# Patient Record
Sex: Female | Born: 1940 | Race: White | Hispanic: No | Marital: Married | Smoking: Never smoker
Health system: Southern US, Community
[De-identification: ages and names within clinical notes are randomized; demographics above are authoritative.]

## PROBLEM LIST (undated history)

## (undated) DIAGNOSIS — E785 Hyperlipidemia, unspecified: Secondary | ICD-10-CM

## (undated) DIAGNOSIS — T7840XA Allergy, unspecified, initial encounter: Secondary | ICD-10-CM

## (undated) DIAGNOSIS — E079 Disorder of thyroid, unspecified: Secondary | ICD-10-CM

## (undated) DIAGNOSIS — Z86711 Personal history of pulmonary embolism: Secondary | ICD-10-CM

## (undated) DIAGNOSIS — J40 Bronchitis, not specified as acute or chronic: Secondary | ICD-10-CM

## (undated) DIAGNOSIS — K5792 Diverticulitis of intestine, part unspecified, without perforation or abscess without bleeding: Secondary | ICD-10-CM

## (undated) HISTORY — PX: HERNIA REPAIR: SHX51

## (undated) HISTORY — DX: Allergy, unspecified, initial encounter: T78.40XA

## (undated) HISTORY — PX: COLONOSCOPY: SHX174

## (undated) HISTORY — DX: Hyperlipidemia, unspecified: E78.5

## (undated) HISTORY — PX: ABDOMINAL HYSTERECTOMY: SUR658

## (undated) HISTORY — DX: Personal history of pulmonary embolism: Z86.711

## (undated) HISTORY — PX: TONSILLECTOMY: SUR1361

## (undated) HISTORY — DX: Bronchitis, not specified as acute or chronic: J40

## (undated) HISTORY — DX: Disorder of thyroid, unspecified: E07.9

## (undated) HISTORY — PX: BACK SURGERY: SHX140

## (undated) HISTORY — PX: POLYPECTOMY: SHX149

---

## 2010-02-19 ENCOUNTER — Observation Stay (HOSPITAL_COMMUNITY): Admission: RE | Admit: 2010-02-19 | Discharge: 2010-02-20 | Payer: Self-pay | Admitting: Orthopedic Surgery

## 2010-07-24 LAB — COMPREHENSIVE METABOLIC PANEL
Alkaline Phosphatase: 73 U/L (ref 39–117)
BUN: 10 mg/dL (ref 6–23)
Chloride: 99 mEq/L (ref 96–112)
Creatinine, Ser: 0.9 mg/dL (ref 0.4–1.2)
Glucose, Bld: 115 mg/dL — ABNORMAL HIGH (ref 70–99)
Potassium: 2.9 mEq/L — ABNORMAL LOW (ref 3.5–5.1)
Total Bilirubin: 1 mg/dL (ref 0.3–1.2)
Total Protein: 6.7 g/dL (ref 6.0–8.3)

## 2010-07-24 LAB — SURGICAL PCR SCREEN
MRSA, PCR: NEGATIVE
Staphylococcus aureus: NEGATIVE

## 2010-07-24 LAB — CBC
HCT: 39.1 % (ref 36.0–46.0)
MCH: 32.4 pg (ref 26.0–34.0)
MCV: 95.1 fL (ref 78.0–100.0)
RDW: 12.9 % (ref 11.5–15.5)
WBC: 8.4 10*3/uL (ref 4.0–10.5)

## 2010-12-12 ENCOUNTER — Other Ambulatory Visit (HOSPITAL_COMMUNITY): Payer: Self-pay | Admitting: Orthopedic Surgery

## 2010-12-12 ENCOUNTER — Other Ambulatory Visit: Payer: Self-pay | Admitting: Orthopedic Surgery

## 2010-12-12 ENCOUNTER — Encounter (HOSPITAL_COMMUNITY): Payer: Medicare Other

## 2010-12-12 ENCOUNTER — Ambulatory Visit (HOSPITAL_COMMUNITY)
Admission: RE | Admit: 2010-12-12 | Discharge: 2010-12-12 | Disposition: A | Discharge status: Home / Self Care | Payer: Medicare Other | Source: Ambulatory Visit | Attending: Orthopedic Surgery | Admitting: Orthopedic Surgery

## 2010-12-12 DIAGNOSIS — Z79899 Other long term (current) drug therapy: Secondary | ICD-10-CM | POA: Insufficient documentation

## 2010-12-12 DIAGNOSIS — M4800 Spinal stenosis, site unspecified: Secondary | ICD-10-CM | POA: Insufficient documentation

## 2010-12-12 DIAGNOSIS — Z01812 Encounter for preprocedural laboratory examination: Secondary | ICD-10-CM | POA: Insufficient documentation

## 2010-12-12 DIAGNOSIS — I10 Essential (primary) hypertension: Secondary | ICD-10-CM | POA: Insufficient documentation

## 2010-12-12 DIAGNOSIS — Z01818 Encounter for other preprocedural examination: Secondary | ICD-10-CM

## 2010-12-12 DIAGNOSIS — Z01810 Encounter for preprocedural cardiovascular examination: Secondary | ICD-10-CM | POA: Insufficient documentation

## 2010-12-12 LAB — CBC
MCH: 31.8 pg (ref 26.0–34.0)
MCHC: 34.4 g/dL (ref 30.0–36.0)
Platelets: 273 10*3/uL (ref 150–400)
RDW: 13.4 % (ref 11.5–15.5)

## 2010-12-12 LAB — BASIC METABOLIC PANEL
Calcium: 9.5 mg/dL (ref 8.4–10.5)
GFR calc Af Amer: >60 mL/min (ref >60)
GFR calc non Af Amer: 50 mL/min — ABNORMAL LOW (ref >60)
Sodium: 138 mEq/L (ref 135–145)

## 2010-12-12 LAB — SURGICAL PCR SCREEN
MRSA, PCR: NEGATIVE
Staphylococcus aureus: NEGATIVE

## 2010-12-17 ENCOUNTER — Ambulatory Visit (HOSPITAL_COMMUNITY): Payer: Medicare Other

## 2010-12-17 ENCOUNTER — Other Ambulatory Visit: Payer: Self-pay | Admitting: Orthopedic Surgery

## 2010-12-17 ENCOUNTER — Ambulatory Visit (HOSPITAL_COMMUNITY)
Admission: RE | Admit: 2010-12-17 | Discharge: 2010-12-18 | Disposition: A | Discharge status: Home / Self Care | Payer: Medicare Other | Source: Ambulatory Visit | Attending: Orthopedic Surgery | Admitting: Orthopedic Surgery

## 2010-12-17 DIAGNOSIS — M7130 Other bursal cyst, unspecified site: Secondary | ICD-10-CM | POA: Insufficient documentation

## 2010-12-17 DIAGNOSIS — I10 Essential (primary) hypertension: Secondary | ICD-10-CM | POA: Insufficient documentation

## 2010-12-17 DIAGNOSIS — E039 Hypothyroidism, unspecified: Secondary | ICD-10-CM | POA: Insufficient documentation

## 2010-12-17 DIAGNOSIS — M48061 Spinal stenosis, lumbar region without neurogenic claudication: Secondary | ICD-10-CM | POA: Insufficient documentation

## 2010-12-17 DIAGNOSIS — Z79899 Other long term (current) drug therapy: Secondary | ICD-10-CM | POA: Insufficient documentation

## 2010-12-17 DIAGNOSIS — M79609 Pain in unspecified limb: Secondary | ICD-10-CM | POA: Insufficient documentation

## 2010-12-22 NOTE — Op Note (Signed)
Laurie Parker, HESLIN NO.:  [id number]  MEDICAL RECORD NO.:  [id number]  LOCATION:  1618                         FACILITY:  [facility]  PHYSICIAN:  Marlowe Kays, M.D.  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  OPERATION:  Subtotal resection of theinferior articular facet of L3 left with excision of synovial cyst and decompression of the lateral recess of the spine.  SURGEON:  Marlowe Kays, M.D.  ASSISTANT:  Georges Lynch. Darrelyn Hillock, M.D.  ANESTHESIA:  General.  INDICATION OF PROCEDURE:  Original back procedure on her was in October of last year.  Initially, she did well.  Recently, she has had progressive left leg pain with an MRI demonstrating the preoperative diagnosis.  Accordingly, she is here today for the above-mentioned surgery.  DESCRIPTION OF THE PROCEDURE:  Prophylactic antibiotics, satisfied general anesthesia, Foley catheter inserted, and positioned in the knee- chest position on the Andrews frame, back was prepped with DuraPrep and draped in sterile field.  Time-out was performed.  I went through the superior portion of the old incision and tagged the spinous process which I thought was most likely L2 and then placed Penfield 4 instrument distally on the left to try and identify the L3-4 disk since a synovial cyst is located at this level.  X-ray demonstrated that the Zannie Cove was indeed on spinous process of L2 and that the disk was slightly distal to the [employer].  Accordingly, I replaced the [facility] instrument, took second lateral x-ray, and this was basically right at the L3-4 disk.  Using this as a guideline and working  with combination of instruments with the small and large curettes, 2 and 3 mm Kerrison rongeurs and double-action rongeur was able to define a bony perimeter superiorly, inferiorly, and lateral.  After trying several avenues to expose the L3-4 disk area, it worked out that tacking the L3-4 inferior facet worked best and 2 mm Kerrison rongeur began resecting some of the facet and found what we felt was the synovial cyst material and I saved a portion of this for pathology.  I then continued on subtotal facetectomy, working towards the midline and superiorly until I was able to get to the spinal canal safely.  Then I was able to work laterally and distally down to the area of the L4.  There was no more cyst noted. Then, we able to adequately decompress the spinal canal from top to bottom.  Distally, the L4 nerve root was identified and we were able to place a nerve hook.  I was satisfied that we had excised the cyst and decompressed the spinal canal, and also the L4 nerve root, felt that we had accomplished our goals.  Wound was irrigated with sterile saline. Gelfoam soaked in thrombin was placed in the operative bed in the left, self-retaining spinal retractors were moved with no  unusual bleeding.  I then closed the wound in layers with interrupted 1 Vicryl in the fascia and deep subcutaneous tissue, 2-0 Vicryl to the patient's subcutaneous tissue, staples in the skin, and dry sterile dressing were applied.  She tolerated the procedure well, and the patient was on the way to recovery room in satisfactory condition with no complications. Estimated blood loss was perhaps 150 cc.  No blood replaced.          ______________________________ Marlowe Kays, M.D.     JA/MEDQ  D:  12/17/2010  T:  12/18/2010  Job:  [id number]  Electronically Signed by Marlowe Kays M.D. on 12/22/2010 01:14:53 PM

## 2011-05-12 HISTORY — PX: COLOSTOMY: SHX63

## 2011-05-15 IMAGING — CR DG OR PORTABLE SPINE
1 series · 1 of 1 positions shown · non-contrast
Comparison: None

CLINICAL DATA: Herniated disc L3-4

PORTABLE SPINE

[series 1]
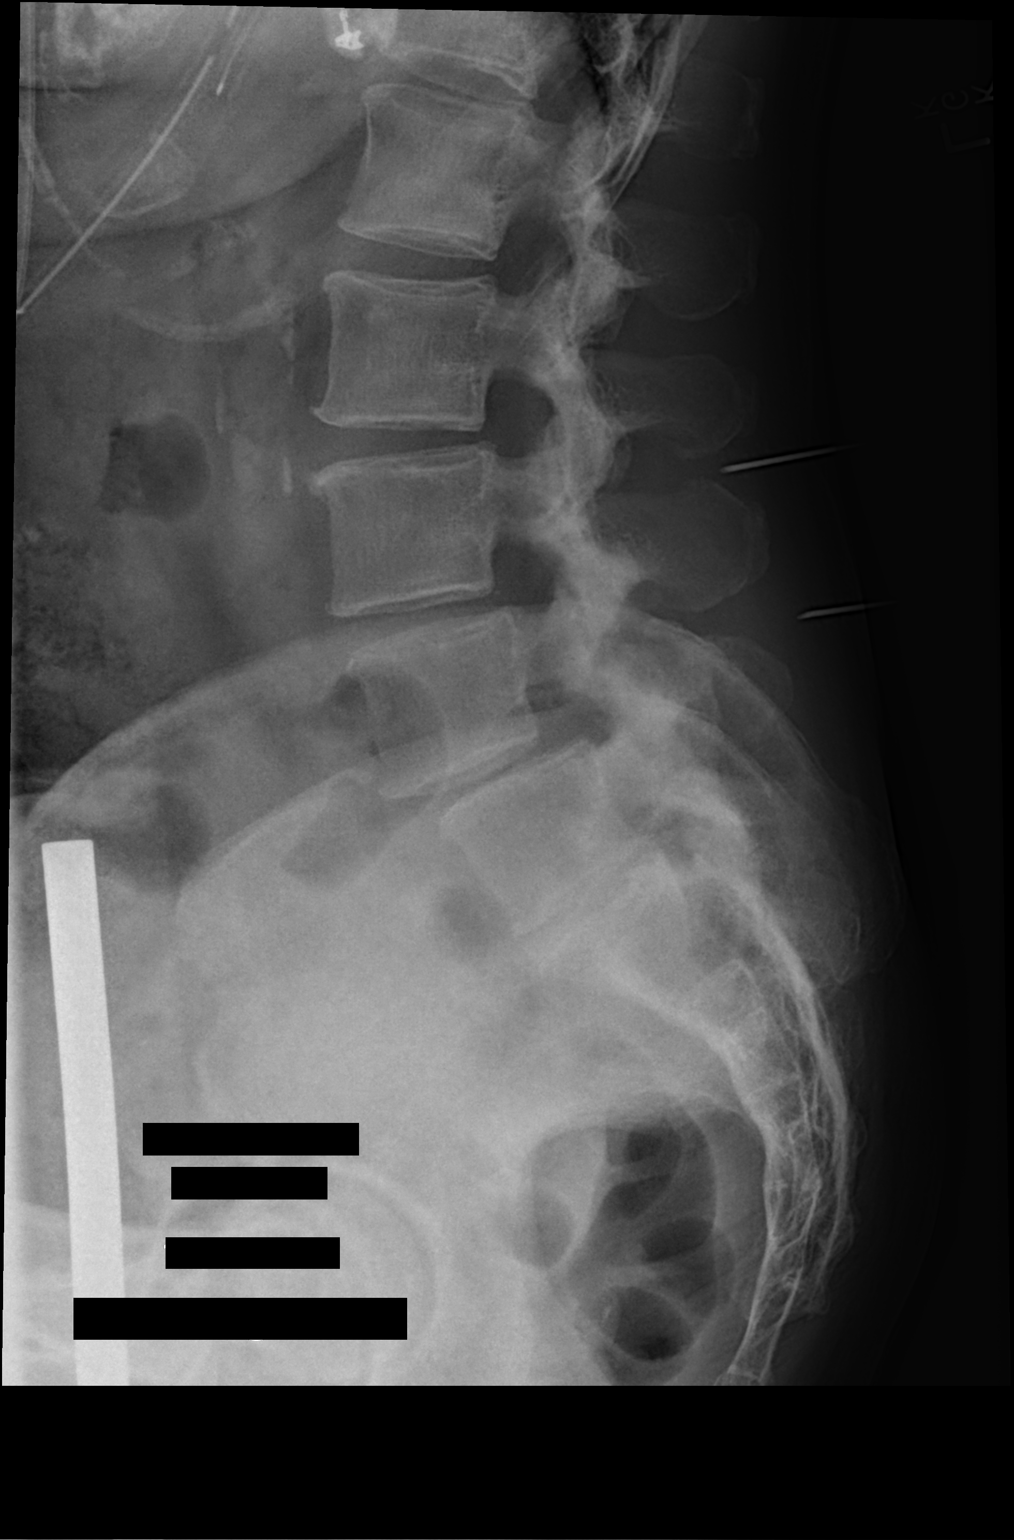

[1 of 1 positions shown; findings below may reference images not displayed]

FINDINGS: No prior studies are available to determine the lumbar
level assignment.  I will assume that the lowest disc space is L5-
S1.  Grade 1/II slip of L4-L5 is present.  Negative for fracture.

There is a needle between the spinous processes of L2 and L3.
There is a second needle directed between the spinous processes of
L3 and L4.
IMPRESSION: Anterior slip of L4-L5.  Lowest disc spaces L5-S1.

Lumbar localization as above.

## 2011-06-03 DIAGNOSIS — D126 Benign neoplasm of colon, unspecified: Secondary | ICD-10-CM | POA: Diagnosis not present

## 2011-06-12 DIAGNOSIS — J45909 Unspecified asthma, uncomplicated: Secondary | ICD-10-CM | POA: Diagnosis not present

## 2011-06-12 DIAGNOSIS — Z79899 Other long term (current) drug therapy: Secondary | ICD-10-CM | POA: Diagnosis not present

## 2011-06-12 DIAGNOSIS — K389 Disease of appendix, unspecified: Secondary | ICD-10-CM | POA: Diagnosis not present

## 2011-06-12 DIAGNOSIS — Z85828 Personal history of other malignant neoplasm of skin: Secondary | ICD-10-CM | POA: Diagnosis not present

## 2011-06-12 DIAGNOSIS — I10 Essential (primary) hypertension: Secondary | ICD-10-CM | POA: Diagnosis not present

## 2011-06-12 DIAGNOSIS — D126 Benign neoplasm of colon, unspecified: Secondary | ICD-10-CM | POA: Diagnosis not present

## 2011-06-26 DIAGNOSIS — E785 Hyperlipidemia, unspecified: Secondary | ICD-10-CM | POA: Diagnosis not present

## 2011-06-26 DIAGNOSIS — I059 Rheumatic mitral valve disease, unspecified: Secondary | ICD-10-CM | POA: Diagnosis not present

## 2011-06-26 DIAGNOSIS — J309 Allergic rhinitis, unspecified: Secondary | ICD-10-CM | POA: Diagnosis present

## 2011-06-26 DIAGNOSIS — J9620 Acute and chronic respiratory failure, unspecified whether with hypoxia or hypercapnia: Secondary | ICD-10-CM | POA: Diagnosis not present

## 2011-06-26 DIAGNOSIS — E039 Hypothyroidism, unspecified: Secondary | ICD-10-CM | POA: Diagnosis not present

## 2011-06-26 DIAGNOSIS — I509 Heart failure, unspecified: Secondary | ICD-10-CM | POA: Diagnosis not present

## 2011-06-26 DIAGNOSIS — I2699 Other pulmonary embolism without acute cor pulmonale: Secondary | ICD-10-CM | POA: Diagnosis not present

## 2011-06-26 DIAGNOSIS — I130 Hypertensive heart and chronic kidney disease with heart failure and stage 1 through stage 4 chronic kidney disease, or unspecified chronic kidney disease: Secondary | ICD-10-CM | POA: Diagnosis not present

## 2011-06-26 DIAGNOSIS — D638 Anemia in other chronic diseases classified elsewhere: Secondary | ICD-10-CM | POA: Diagnosis not present

## 2011-06-26 DIAGNOSIS — K219 Gastro-esophageal reflux disease without esophagitis: Secondary | ICD-10-CM | POA: Diagnosis not present

## 2011-06-26 DIAGNOSIS — D649 Anemia, unspecified: Secondary | ICD-10-CM | POA: Diagnosis not present

## 2011-06-26 DIAGNOSIS — M159 Polyosteoarthritis, unspecified: Secondary | ICD-10-CM | POA: Diagnosis not present

## 2011-06-26 DIAGNOSIS — I119 Hypertensive heart disease without heart failure: Secondary | ICD-10-CM | POA: Diagnosis not present

## 2011-06-26 DIAGNOSIS — IMO0002 Reserved for concepts with insufficient information to code with codable children: Secondary | ICD-10-CM | POA: Diagnosis present

## 2011-06-26 DIAGNOSIS — J9600 Acute respiratory failure, unspecified whether with hypoxia or hypercapnia: Secondary | ICD-10-CM | POA: Diagnosis not present

## 2011-07-01 DIAGNOSIS — I2699 Other pulmonary embolism without acute cor pulmonale: Secondary | ICD-10-CM | POA: Diagnosis not present

## 2011-07-01 DIAGNOSIS — D649 Anemia, unspecified: Secondary | ICD-10-CM | POA: Diagnosis not present

## 2011-07-03 DIAGNOSIS — R791 Abnormal coagulation profile: Secondary | ICD-10-CM | POA: Diagnosis not present

## 2011-07-07 DIAGNOSIS — R791 Abnormal coagulation profile: Secondary | ICD-10-CM | POA: Diagnosis not present

## 2011-07-08 DIAGNOSIS — D649 Anemia, unspecified: Secondary | ICD-10-CM | POA: Diagnosis not present

## 2011-07-08 DIAGNOSIS — R05 Cough: Secondary | ICD-10-CM | POA: Diagnosis not present

## 2011-07-08 DIAGNOSIS — I2699 Other pulmonary embolism without acute cor pulmonale: Secondary | ICD-10-CM | POA: Diagnosis not present

## 2011-07-14 DIAGNOSIS — R791 Abnormal coagulation profile: Secondary | ICD-10-CM | POA: Diagnosis not present

## 2011-07-14 DIAGNOSIS — Z7901 Long term (current) use of anticoagulants: Secondary | ICD-10-CM | POA: Diagnosis not present

## 2011-07-21 DIAGNOSIS — R791 Abnormal coagulation profile: Secondary | ICD-10-CM | POA: Diagnosis not present

## 2011-07-28 DIAGNOSIS — Z7901 Long term (current) use of anticoagulants: Secondary | ICD-10-CM | POA: Diagnosis not present

## 2011-08-06 DIAGNOSIS — Z7901 Long term (current) use of anticoagulants: Secondary | ICD-10-CM | POA: Diagnosis not present

## 2011-08-06 DIAGNOSIS — M79609 Pain in unspecified limb: Secondary | ICD-10-CM | POA: Diagnosis not present

## 2011-08-06 DIAGNOSIS — M7730 Calcaneal spur, unspecified foot: Secondary | ICD-10-CM | POA: Diagnosis not present

## 2011-08-11 DIAGNOSIS — R791 Abnormal coagulation profile: Secondary | ICD-10-CM | POA: Diagnosis not present

## 2011-08-11 DIAGNOSIS — Z7901 Long term (current) use of anticoagulants: Secondary | ICD-10-CM | POA: Diagnosis not present

## 2011-08-20 DIAGNOSIS — I10 Essential (primary) hypertension: Secondary | ICD-10-CM | POA: Diagnosis not present

## 2011-08-20 DIAGNOSIS — D509 Iron deficiency anemia, unspecified: Secondary | ICD-10-CM | POA: Diagnosis not present

## 2011-08-20 DIAGNOSIS — K219 Gastro-esophageal reflux disease without esophagitis: Secondary | ICD-10-CM | POA: Diagnosis not present

## 2011-08-20 DIAGNOSIS — E785 Hyperlipidemia, unspecified: Secondary | ICD-10-CM | POA: Diagnosis not present

## 2011-08-20 DIAGNOSIS — E039 Hypothyroidism, unspecified: Secondary | ICD-10-CM | POA: Diagnosis not present

## 2011-08-20 DIAGNOSIS — I2699 Other pulmonary embolism without acute cor pulmonale: Secondary | ICD-10-CM | POA: Diagnosis not present

## 2011-08-20 DIAGNOSIS — Z1231 Encounter for screening mammogram for malignant neoplasm of breast: Secondary | ICD-10-CM | POA: Diagnosis not present

## 2011-08-25 DIAGNOSIS — R791 Abnormal coagulation profile: Secondary | ICD-10-CM | POA: Diagnosis not present

## 2011-08-28 DIAGNOSIS — Z1231 Encounter for screening mammogram for malignant neoplasm of breast: Secondary | ICD-10-CM | POA: Diagnosis not present

## 2011-09-04 DIAGNOSIS — Z7901 Long term (current) use of anticoagulants: Secondary | ICD-10-CM | POA: Diagnosis not present

## 2011-09-06 DIAGNOSIS — Z7901 Long term (current) use of anticoagulants: Secondary | ICD-10-CM | POA: Diagnosis not present

## 2011-09-06 DIAGNOSIS — R079 Chest pain, unspecified: Secondary | ICD-10-CM | POA: Diagnosis not present

## 2011-09-06 DIAGNOSIS — E039 Hypothyroidism, unspecified: Secondary | ICD-10-CM | POA: Diagnosis not present

## 2011-09-06 DIAGNOSIS — R0789 Other chest pain: Secondary | ICD-10-CM | POA: Diagnosis not present

## 2011-09-18 DIAGNOSIS — Z7901 Long term (current) use of anticoagulants: Secondary | ICD-10-CM | POA: Diagnosis not present

## 2011-10-19 DIAGNOSIS — Z7901 Long term (current) use of anticoagulants: Secondary | ICD-10-CM | POA: Diagnosis not present

## 2011-10-20 DIAGNOSIS — E039 Hypothyroidism, unspecified: Secondary | ICD-10-CM | POA: Diagnosis not present

## 2011-10-20 DIAGNOSIS — IMO0002 Reserved for concepts with insufficient information to code with codable children: Secondary | ICD-10-CM | POA: Diagnosis not present

## 2011-10-20 DIAGNOSIS — I2699 Other pulmonary embolism without acute cor pulmonale: Secondary | ICD-10-CM | POA: Diagnosis not present

## 2011-10-20 DIAGNOSIS — K219 Gastro-esophageal reflux disease without esophagitis: Secondary | ICD-10-CM | POA: Diagnosis not present

## 2011-10-20 DIAGNOSIS — D509 Iron deficiency anemia, unspecified: Secondary | ICD-10-CM | POA: Diagnosis not present

## 2011-10-20 DIAGNOSIS — E785 Hyperlipidemia, unspecified: Secondary | ICD-10-CM | POA: Diagnosis not present

## 2011-10-20 DIAGNOSIS — Z79899 Other long term (current) drug therapy: Secondary | ICD-10-CM | POA: Diagnosis not present

## 2011-11-11 DIAGNOSIS — Z7901 Long term (current) use of anticoagulants: Secondary | ICD-10-CM | POA: Diagnosis not present

## 2011-11-17 DIAGNOSIS — M1990 Unspecified osteoarthritis, unspecified site: Secondary | ICD-10-CM | POA: Diagnosis not present

## 2011-11-17 DIAGNOSIS — M546 Pain in thoracic spine: Secondary | ICD-10-CM | POA: Diagnosis not present

## 2011-11-17 DIAGNOSIS — M4120 Other idiopathic scoliosis, site unspecified: Secondary | ICD-10-CM | POA: Diagnosis not present

## 2011-11-23 DIAGNOSIS — R937 Abnormal findings on diagnostic imaging of other parts of musculoskeletal system: Secondary | ICD-10-CM | POA: Diagnosis not present

## 2011-11-23 DIAGNOSIS — M546 Pain in thoracic spine: Secondary | ICD-10-CM | POA: Diagnosis not present

## 2011-11-23 DIAGNOSIS — M47817 Spondylosis without myelopathy or radiculopathy, lumbosacral region: Secondary | ICD-10-CM | POA: Diagnosis not present

## 2011-12-03 DIAGNOSIS — M546 Pain in thoracic spine: Secondary | ICD-10-CM | POA: Diagnosis not present

## 2011-12-03 DIAGNOSIS — M1990 Unspecified osteoarthritis, unspecified site: Secondary | ICD-10-CM | POA: Diagnosis not present

## 2011-12-03 DIAGNOSIS — M4310 Spondylolisthesis, site unspecified: Secondary | ICD-10-CM | POA: Diagnosis not present

## 2011-12-10 DIAGNOSIS — Z7901 Long term (current) use of anticoagulants: Secondary | ICD-10-CM | POA: Diagnosis not present

## 2011-12-18 DIAGNOSIS — R29898 Other symptoms and signs involving the musculoskeletal system: Secondary | ICD-10-CM | POA: Diagnosis not present

## 2011-12-18 DIAGNOSIS — IMO0001 Reserved for inherently not codable concepts without codable children: Secondary | ICD-10-CM | POA: Diagnosis not present

## 2011-12-18 DIAGNOSIS — M47812 Spondylosis without myelopathy or radiculopathy, cervical region: Secondary | ICD-10-CM | POA: Diagnosis not present

## 2011-12-18 DIAGNOSIS — M1990 Unspecified osteoarthritis, unspecified site: Secondary | ICD-10-CM | POA: Diagnosis not present

## 2011-12-18 DIAGNOSIS — M6281 Muscle weakness (generalized): Secondary | ICD-10-CM | POA: Diagnosis not present

## 2011-12-22 DIAGNOSIS — M1990 Unspecified osteoarthritis, unspecified site: Secondary | ICD-10-CM | POA: Diagnosis not present

## 2011-12-22 DIAGNOSIS — IMO0001 Reserved for inherently not codable concepts without codable children: Secondary | ICD-10-CM | POA: Diagnosis not present

## 2011-12-22 DIAGNOSIS — M47812 Spondylosis without myelopathy or radiculopathy, cervical region: Secondary | ICD-10-CM | POA: Diagnosis not present

## 2011-12-22 DIAGNOSIS — R29898 Other symptoms and signs involving the musculoskeletal system: Secondary | ICD-10-CM | POA: Diagnosis not present

## 2011-12-22 DIAGNOSIS — M6281 Muscle weakness (generalized): Secondary | ICD-10-CM | POA: Diagnosis not present

## 2011-12-24 DIAGNOSIS — M47812 Spondylosis without myelopathy or radiculopathy, cervical region: Secondary | ICD-10-CM | POA: Diagnosis not present

## 2011-12-24 DIAGNOSIS — M6281 Muscle weakness (generalized): Secondary | ICD-10-CM | POA: Diagnosis not present

## 2011-12-24 DIAGNOSIS — R29898 Other symptoms and signs involving the musculoskeletal system: Secondary | ICD-10-CM | POA: Diagnosis not present

## 2011-12-24 DIAGNOSIS — IMO0001 Reserved for inherently not codable concepts without codable children: Secondary | ICD-10-CM | POA: Diagnosis not present

## 2011-12-24 DIAGNOSIS — M1990 Unspecified osteoarthritis, unspecified site: Secondary | ICD-10-CM | POA: Diagnosis not present

## 2011-12-28 DIAGNOSIS — R29898 Other symptoms and signs involving the musculoskeletal system: Secondary | ICD-10-CM | POA: Diagnosis not present

## 2011-12-28 DIAGNOSIS — IMO0001 Reserved for inherently not codable concepts without codable children: Secondary | ICD-10-CM | POA: Diagnosis not present

## 2011-12-28 DIAGNOSIS — M6281 Muscle weakness (generalized): Secondary | ICD-10-CM | POA: Diagnosis not present

## 2011-12-28 DIAGNOSIS — M1990 Unspecified osteoarthritis, unspecified site: Secondary | ICD-10-CM | POA: Diagnosis not present

## 2011-12-28 DIAGNOSIS — M47812 Spondylosis without myelopathy or radiculopathy, cervical region: Secondary | ICD-10-CM | POA: Diagnosis not present

## 2011-12-31 DIAGNOSIS — R29898 Other symptoms and signs involving the musculoskeletal system: Secondary | ICD-10-CM | POA: Diagnosis not present

## 2011-12-31 DIAGNOSIS — IMO0001 Reserved for inherently not codable concepts without codable children: Secondary | ICD-10-CM | POA: Diagnosis not present

## 2011-12-31 DIAGNOSIS — M6281 Muscle weakness (generalized): Secondary | ICD-10-CM | POA: Diagnosis not present

## 2011-12-31 DIAGNOSIS — M47812 Spondylosis without myelopathy or radiculopathy, cervical region: Secondary | ICD-10-CM | POA: Diagnosis not present

## 2011-12-31 DIAGNOSIS — M1990 Unspecified osteoarthritis, unspecified site: Secondary | ICD-10-CM | POA: Diagnosis not present

## 2012-01-05 DIAGNOSIS — M6281 Muscle weakness (generalized): Secondary | ICD-10-CM | POA: Diagnosis not present

## 2012-01-05 DIAGNOSIS — M47812 Spondylosis without myelopathy or radiculopathy, cervical region: Secondary | ICD-10-CM | POA: Diagnosis not present

## 2012-01-05 DIAGNOSIS — IMO0001 Reserved for inherently not codable concepts without codable children: Secondary | ICD-10-CM | POA: Diagnosis not present

## 2012-01-05 DIAGNOSIS — R29898 Other symptoms and signs involving the musculoskeletal system: Secondary | ICD-10-CM | POA: Diagnosis not present

## 2012-01-05 DIAGNOSIS — M1990 Unspecified osteoarthritis, unspecified site: Secondary | ICD-10-CM | POA: Diagnosis not present

## 2012-01-08 DIAGNOSIS — M6281 Muscle weakness (generalized): Secondary | ICD-10-CM | POA: Diagnosis not present

## 2012-01-08 DIAGNOSIS — IMO0001 Reserved for inherently not codable concepts without codable children: Secondary | ICD-10-CM | POA: Diagnosis not present

## 2012-01-08 DIAGNOSIS — R29898 Other symptoms and signs involving the musculoskeletal system: Secondary | ICD-10-CM | POA: Diagnosis not present

## 2012-01-08 DIAGNOSIS — M47812 Spondylosis without myelopathy or radiculopathy, cervical region: Secondary | ICD-10-CM | POA: Diagnosis not present

## 2012-01-08 DIAGNOSIS — M1990 Unspecified osteoarthritis, unspecified site: Secondary | ICD-10-CM | POA: Diagnosis not present

## 2012-01-18 DIAGNOSIS — M47812 Spondylosis without myelopathy or radiculopathy, cervical region: Secondary | ICD-10-CM | POA: Diagnosis not present

## 2012-01-18 DIAGNOSIS — R29898 Other symptoms and signs involving the musculoskeletal system: Secondary | ICD-10-CM | POA: Diagnosis not present

## 2012-01-18 DIAGNOSIS — M6281 Muscle weakness (generalized): Secondary | ICD-10-CM | POA: Diagnosis not present

## 2012-01-18 DIAGNOSIS — R791 Abnormal coagulation profile: Secondary | ICD-10-CM | POA: Diagnosis not present

## 2012-01-18 DIAGNOSIS — IMO0001 Reserved for inherently not codable concepts without codable children: Secondary | ICD-10-CM | POA: Diagnosis not present

## 2012-01-18 DIAGNOSIS — M1990 Unspecified osteoarthritis, unspecified site: Secondary | ICD-10-CM | POA: Diagnosis not present

## 2012-01-20 DIAGNOSIS — Z79899 Other long term (current) drug therapy: Secondary | ICD-10-CM | POA: Diagnosis not present

## 2012-01-20 DIAGNOSIS — R29898 Other symptoms and signs involving the musculoskeletal system: Secondary | ICD-10-CM | POA: Diagnosis not present

## 2012-01-20 DIAGNOSIS — M159 Polyosteoarthritis, unspecified: Secondary | ICD-10-CM | POA: Diagnosis not present

## 2012-01-20 DIAGNOSIS — IMO0001 Reserved for inherently not codable concepts without codable children: Secondary | ICD-10-CM | POA: Diagnosis not present

## 2012-01-20 DIAGNOSIS — M47812 Spondylosis without myelopathy or radiculopathy, cervical region: Secondary | ICD-10-CM | POA: Diagnosis not present

## 2012-01-20 DIAGNOSIS — M1990 Unspecified osteoarthritis, unspecified site: Secondary | ICD-10-CM | POA: Diagnosis not present

## 2012-01-20 DIAGNOSIS — M6281 Muscle weakness (generalized): Secondary | ICD-10-CM | POA: Diagnosis not present

## 2012-01-20 DIAGNOSIS — K219 Gastro-esophageal reflux disease without esophagitis: Secondary | ICD-10-CM | POA: Diagnosis not present

## 2012-01-20 DIAGNOSIS — E039 Hypothyroidism, unspecified: Secondary | ICD-10-CM | POA: Diagnosis not present

## 2012-01-22 DIAGNOSIS — R29898 Other symptoms and signs involving the musculoskeletal system: Secondary | ICD-10-CM | POA: Diagnosis not present

## 2012-01-22 DIAGNOSIS — IMO0001 Reserved for inherently not codable concepts without codable children: Secondary | ICD-10-CM | POA: Diagnosis not present

## 2012-01-22 DIAGNOSIS — M1990 Unspecified osteoarthritis, unspecified site: Secondary | ICD-10-CM | POA: Diagnosis not present

## 2012-01-22 DIAGNOSIS — M6281 Muscle weakness (generalized): Secondary | ICD-10-CM | POA: Diagnosis not present

## 2012-01-22 DIAGNOSIS — M47812 Spondylosis without myelopathy or radiculopathy, cervical region: Secondary | ICD-10-CM | POA: Diagnosis not present

## 2012-01-26 DIAGNOSIS — M1990 Unspecified osteoarthritis, unspecified site: Secondary | ICD-10-CM | POA: Diagnosis not present

## 2012-01-26 DIAGNOSIS — R29898 Other symptoms and signs involving the musculoskeletal system: Secondary | ICD-10-CM | POA: Diagnosis not present

## 2012-01-26 DIAGNOSIS — IMO0001 Reserved for inherently not codable concepts without codable children: Secondary | ICD-10-CM | POA: Diagnosis not present

## 2012-01-26 DIAGNOSIS — M6281 Muscle weakness (generalized): Secondary | ICD-10-CM | POA: Diagnosis not present

## 2012-01-26 DIAGNOSIS — M47812 Spondylosis without myelopathy or radiculopathy, cervical region: Secondary | ICD-10-CM | POA: Diagnosis not present

## 2012-01-28 DIAGNOSIS — Z7901 Long term (current) use of anticoagulants: Secondary | ICD-10-CM | POA: Diagnosis not present

## 2012-01-29 DIAGNOSIS — IMO0001 Reserved for inherently not codable concepts without codable children: Secondary | ICD-10-CM | POA: Diagnosis not present

## 2012-01-29 DIAGNOSIS — M1990 Unspecified osteoarthritis, unspecified site: Secondary | ICD-10-CM | POA: Diagnosis not present

## 2012-01-29 DIAGNOSIS — M6281 Muscle weakness (generalized): Secondary | ICD-10-CM | POA: Diagnosis not present

## 2012-01-29 DIAGNOSIS — R29898 Other symptoms and signs involving the musculoskeletal system: Secondary | ICD-10-CM | POA: Diagnosis not present

## 2012-01-29 DIAGNOSIS — M47812 Spondylosis without myelopathy or radiculopathy, cervical region: Secondary | ICD-10-CM | POA: Diagnosis not present

## 2012-01-31 DIAGNOSIS — Z23 Encounter for immunization: Secondary | ICD-10-CM | POA: Diagnosis not present

## 2012-02-10 DIAGNOSIS — H269 Unspecified cataract: Secondary | ICD-10-CM | POA: Diagnosis not present

## 2012-02-10 DIAGNOSIS — H35039 Hypertensive retinopathy, unspecified eye: Secondary | ICD-10-CM | POA: Diagnosis not present

## 2012-02-12 DIAGNOSIS — Z7901 Long term (current) use of anticoagulants: Secondary | ICD-10-CM | POA: Diagnosis not present

## 2012-02-29 DIAGNOSIS — H2510 Age-related nuclear cataract, unspecified eye: Secondary | ICD-10-CM | POA: Diagnosis not present

## 2012-02-29 DIAGNOSIS — H18419 Arcus senilis, unspecified eye: Secondary | ICD-10-CM | POA: Diagnosis not present

## 2012-03-06 IMAGING — CR DG CHEST 2V
2 series · 2 of 2 positions shown · non-contrast
Comparison: None.

CLINICAL DATA: Preoperative evaluation synovial cyst and spinal
stenosis.

CHEST - 2 VIEW

[view not recorded (1 of 2)]
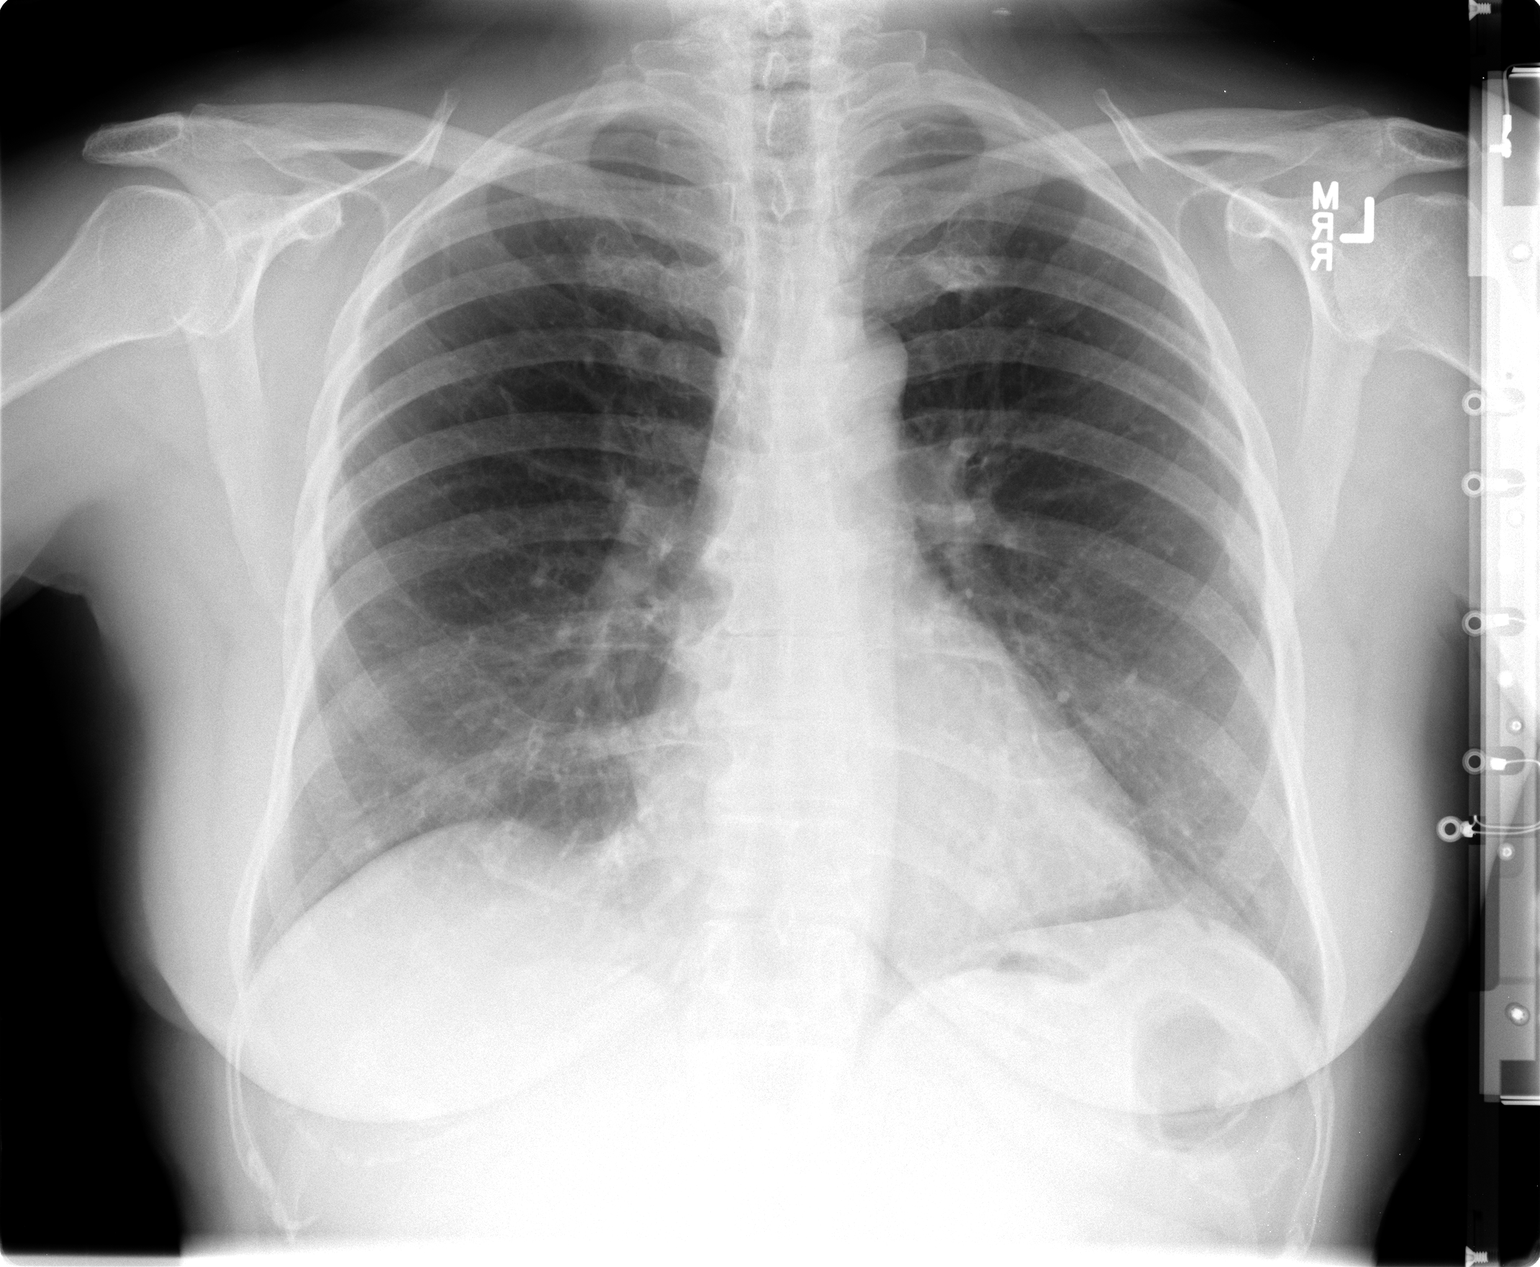

[view not recorded (2 of 2)]
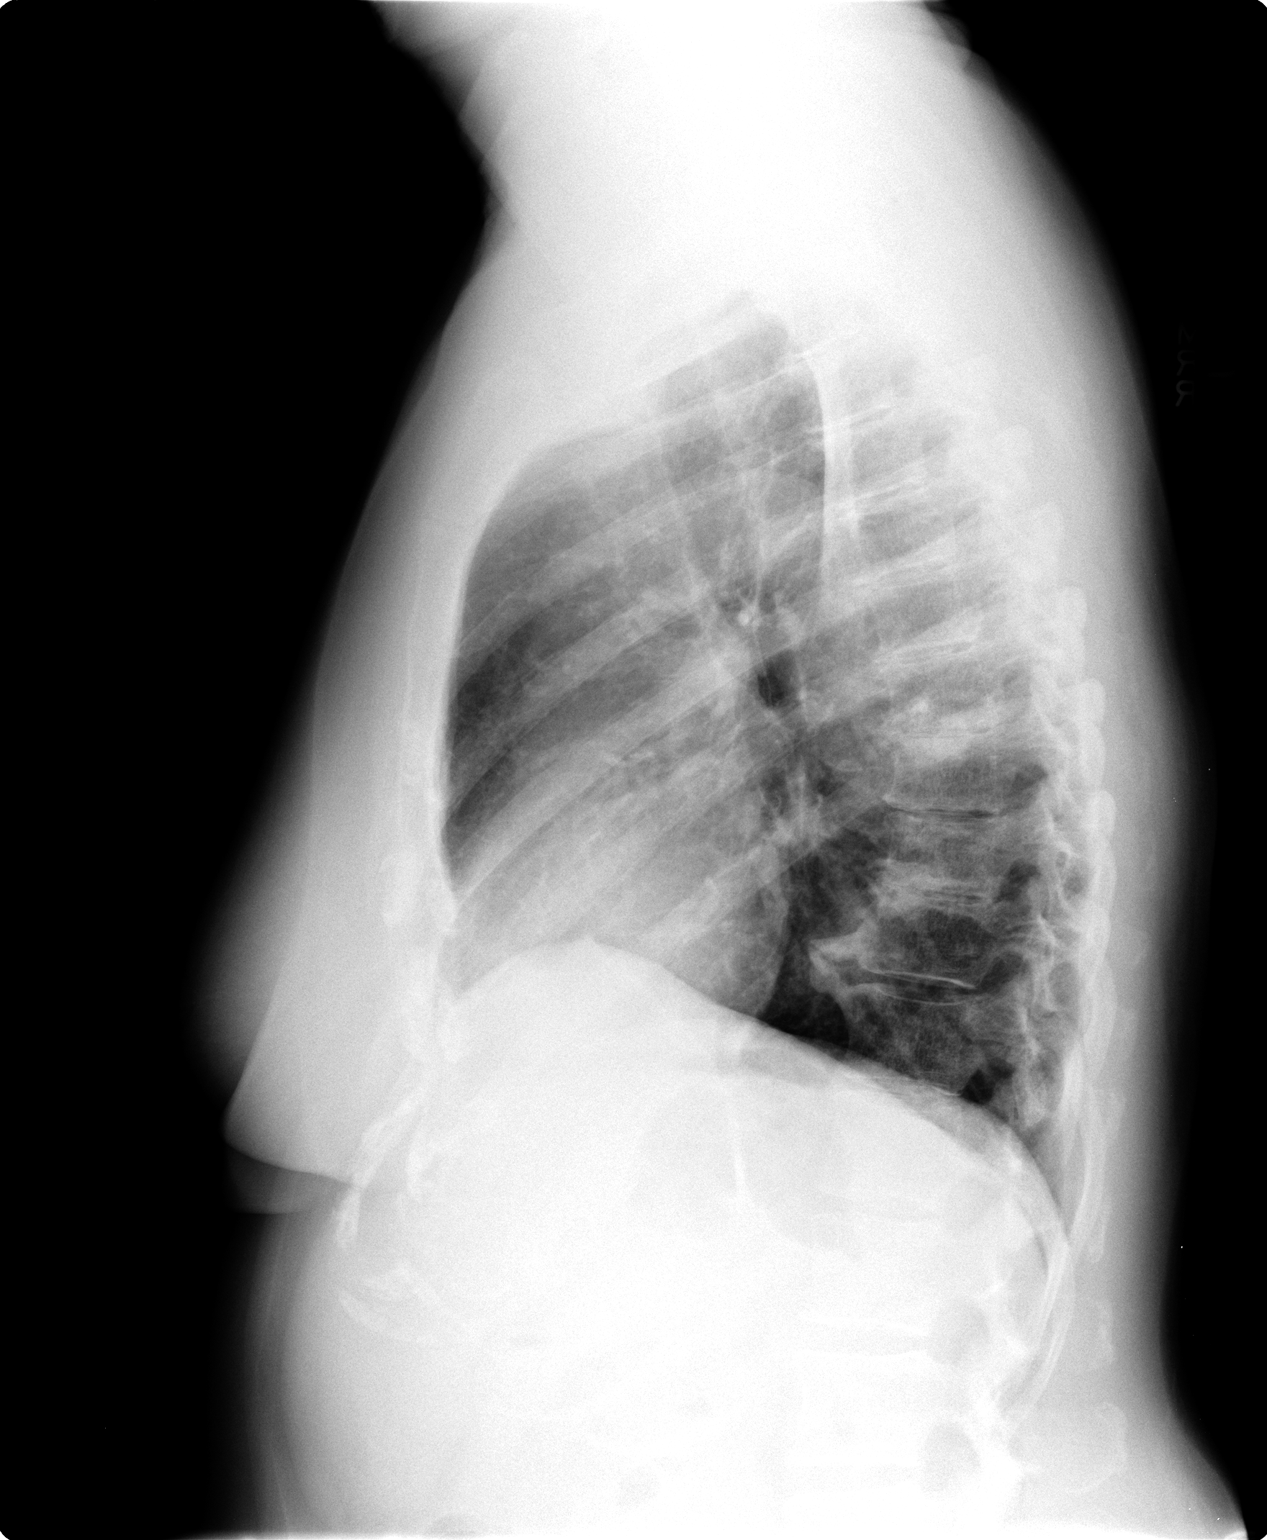

[2 of 2 positions shown; findings below may reference images not displayed]

FINDINGS: The lungs are clear bilaterally.  No confluent airspace
opacities, pleural effuions or pneumothoracies are seen.  The heart
is normal in size and contour.  The upper abdomen and osseous
structures are normal. Degenerative changes are seen in the
thoracic spine.
IMPRESSION: No acute cardiopulmonary disease.

## 2012-03-14 DIAGNOSIS — R791 Abnormal coagulation profile: Secondary | ICD-10-CM | POA: Diagnosis not present

## 2012-03-21 DIAGNOSIS — R791 Abnormal coagulation profile: Secondary | ICD-10-CM | POA: Diagnosis not present

## 2012-04-01 DIAGNOSIS — R791 Abnormal coagulation profile: Secondary | ICD-10-CM | POA: Diagnosis not present

## 2012-04-15 DIAGNOSIS — Z7901 Long term (current) use of anticoagulants: Secondary | ICD-10-CM | POA: Diagnosis not present

## 2012-04-25 DIAGNOSIS — H2510 Age-related nuclear cataract, unspecified eye: Secondary | ICD-10-CM | POA: Diagnosis not present

## 2012-04-25 DIAGNOSIS — H269 Unspecified cataract: Secondary | ICD-10-CM | POA: Diagnosis not present

## 2012-04-26 DIAGNOSIS — H2510 Age-related nuclear cataract, unspecified eye: Secondary | ICD-10-CM | POA: Diagnosis not present

## 2012-04-29 DIAGNOSIS — R791 Abnormal coagulation profile: Secondary | ICD-10-CM | POA: Diagnosis not present

## 2012-05-09 DIAGNOSIS — Z7901 Long term (current) use of anticoagulants: Secondary | ICD-10-CM | POA: Diagnosis not present

## 2012-05-16 DIAGNOSIS — H2510 Age-related nuclear cataract, unspecified eye: Secondary | ICD-10-CM | POA: Diagnosis not present

## 2012-05-16 DIAGNOSIS — H269 Unspecified cataract: Secondary | ICD-10-CM | POA: Diagnosis not present

## 2012-05-20 DIAGNOSIS — Z7901 Long term (current) use of anticoagulants: Secondary | ICD-10-CM | POA: Diagnosis not present

## 2012-05-23 DIAGNOSIS — K219 Gastro-esophageal reflux disease without esophagitis: Secondary | ICD-10-CM | POA: Diagnosis not present

## 2012-05-23 DIAGNOSIS — Z79899 Other long term (current) drug therapy: Secondary | ICD-10-CM | POA: Diagnosis not present

## 2012-05-23 DIAGNOSIS — E039 Hypothyroidism, unspecified: Secondary | ICD-10-CM | POA: Diagnosis not present

## 2012-05-23 DIAGNOSIS — E785 Hyperlipidemia, unspecified: Secondary | ICD-10-CM | POA: Diagnosis not present

## 2012-06-20 DIAGNOSIS — R791 Abnormal coagulation profile: Secondary | ICD-10-CM | POA: Diagnosis not present

## 2012-07-04 DIAGNOSIS — Z7901 Long term (current) use of anticoagulants: Secondary | ICD-10-CM | POA: Diagnosis not present

## 2012-07-12 DIAGNOSIS — J069 Acute upper respiratory infection, unspecified: Secondary | ICD-10-CM | POA: Diagnosis not present

## 2012-07-18 DIAGNOSIS — Z7901 Long term (current) use of anticoagulants: Secondary | ICD-10-CM | POA: Diagnosis not present

## 2012-07-20 DIAGNOSIS — D126 Benign neoplasm of colon, unspecified: Secondary | ICD-10-CM | POA: Diagnosis not present

## 2012-07-25 DIAGNOSIS — R1031 Right lower quadrant pain: Secondary | ICD-10-CM | POA: Diagnosis not present

## 2012-07-26 DIAGNOSIS — Z86711 Personal history of pulmonary embolism: Secondary | ICD-10-CM | POA: Diagnosis not present

## 2012-08-08 DIAGNOSIS — I10 Essential (primary) hypertension: Secondary | ICD-10-CM | POA: Diagnosis not present

## 2012-08-08 DIAGNOSIS — E079 Disorder of thyroid, unspecified: Secondary | ICD-10-CM | POA: Diagnosis not present

## 2012-08-08 DIAGNOSIS — Z79899 Other long term (current) drug therapy: Secondary | ICD-10-CM | POA: Diagnosis not present

## 2012-08-08 DIAGNOSIS — J45909 Unspecified asthma, uncomplicated: Secondary | ICD-10-CM | POA: Diagnosis not present

## 2012-08-08 DIAGNOSIS — E039 Hypothyroidism, unspecified: Secondary | ICD-10-CM | POA: Diagnosis not present

## 2012-08-08 DIAGNOSIS — K432 Incisional hernia without obstruction or gangrene: Secondary | ICD-10-CM | POA: Diagnosis not present

## 2012-08-09 DIAGNOSIS — K432 Incisional hernia without obstruction or gangrene: Secondary | ICD-10-CM | POA: Diagnosis not present

## 2012-08-09 DIAGNOSIS — Z79899 Other long term (current) drug therapy: Secondary | ICD-10-CM | POA: Diagnosis not present

## 2012-08-09 DIAGNOSIS — J45909 Unspecified asthma, uncomplicated: Secondary | ICD-10-CM | POA: Diagnosis not present

## 2012-08-09 DIAGNOSIS — E079 Disorder of thyroid, unspecified: Secondary | ICD-10-CM | POA: Diagnosis not present

## 2012-08-09 DIAGNOSIS — I10 Essential (primary) hypertension: Secondary | ICD-10-CM | POA: Diagnosis not present

## 2012-09-06 DIAGNOSIS — Z85038 Personal history of other malignant neoplasm of large intestine: Secondary | ICD-10-CM | POA: Diagnosis not present

## 2012-09-23 DIAGNOSIS — N390 Urinary tract infection, site not specified: Secondary | ICD-10-CM | POA: Diagnosis not present

## 2012-09-23 DIAGNOSIS — R350 Frequency of micturition: Secondary | ICD-10-CM | POA: Diagnosis not present

## 2012-09-23 DIAGNOSIS — E039 Hypothyroidism, unspecified: Secondary | ICD-10-CM | POA: Diagnosis not present

## 2012-10-07 DIAGNOSIS — R070 Pain in throat: Secondary | ICD-10-CM | POA: Diagnosis not present

## 2012-10-07 DIAGNOSIS — R319 Hematuria, unspecified: Secondary | ICD-10-CM | POA: Diagnosis not present

## 2012-10-07 DIAGNOSIS — Z1231 Encounter for screening mammogram for malignant neoplasm of breast: Secondary | ICD-10-CM | POA: Diagnosis not present

## 2012-10-12 DIAGNOSIS — J029 Acute pharyngitis, unspecified: Secondary | ICD-10-CM | POA: Diagnosis not present

## 2012-10-12 DIAGNOSIS — R070 Pain in throat: Secondary | ICD-10-CM | POA: Diagnosis not present

## 2012-10-12 DIAGNOSIS — R1310 Dysphagia, unspecified: Secondary | ICD-10-CM | POA: Diagnosis not present

## 2012-10-12 DIAGNOSIS — J342 Deviated nasal septum: Secondary | ICD-10-CM | POA: Diagnosis not present

## 2012-10-21 DIAGNOSIS — R070 Pain in throat: Secondary | ICD-10-CM | POA: Diagnosis not present

## 2012-10-21 DIAGNOSIS — R1310 Dysphagia, unspecified: Secondary | ICD-10-CM | POA: Diagnosis not present

## 2012-10-21 DIAGNOSIS — IMO0001 Reserved for inherently not codable concepts without codable children: Secondary | ICD-10-CM | POA: Diagnosis not present

## 2012-10-25 DIAGNOSIS — K224 Dyskinesia of esophagus: Secondary | ICD-10-CM | POA: Diagnosis not present

## 2012-10-25 DIAGNOSIS — R1310 Dysphagia, unspecified: Secondary | ICD-10-CM | POA: Diagnosis not present

## 2012-10-25 DIAGNOSIS — R070 Pain in throat: Secondary | ICD-10-CM | POA: Diagnosis not present

## 2012-10-25 DIAGNOSIS — Z1231 Encounter for screening mammogram for malignant neoplasm of breast: Secondary | ICD-10-CM | POA: Diagnosis not present

## 2012-11-16 DIAGNOSIS — R1310 Dysphagia, unspecified: Secondary | ICD-10-CM | POA: Diagnosis not present

## 2012-12-13 DIAGNOSIS — H35039 Hypertensive retinopathy, unspecified eye: Secondary | ICD-10-CM | POA: Diagnosis not present

## 2012-12-13 DIAGNOSIS — H26499 Other secondary cataract, unspecified eye: Secondary | ICD-10-CM | POA: Diagnosis not present

## 2013-02-15 DIAGNOSIS — Z23 Encounter for immunization: Secondary | ICD-10-CM | POA: Diagnosis not present

## 2013-03-03 DIAGNOSIS — Z1211 Encounter for screening for malignant neoplasm of colon: Secondary | ICD-10-CM | POA: Diagnosis not present

## 2013-03-27 DIAGNOSIS — N390 Urinary tract infection, site not specified: Secondary | ICD-10-CM | POA: Diagnosis not present

## 2013-03-27 DIAGNOSIS — K219 Gastro-esophageal reflux disease without esophagitis: Secondary | ICD-10-CM | POA: Diagnosis not present

## 2013-03-27 DIAGNOSIS — E785 Hyperlipidemia, unspecified: Secondary | ICD-10-CM | POA: Diagnosis not present

## 2013-03-27 DIAGNOSIS — E039 Hypothyroidism, unspecified: Secondary | ICD-10-CM | POA: Diagnosis not present

## 2013-03-27 DIAGNOSIS — D649 Anemia, unspecified: Secondary | ICD-10-CM | POA: Diagnosis not present

## 2013-03-27 DIAGNOSIS — R229 Localized swelling, mass and lump, unspecified: Secondary | ICD-10-CM | POA: Diagnosis not present

## 2013-04-15 DIAGNOSIS — X503XXA Overexertion from repetitive movements, initial encounter: Secondary | ICD-10-CM | POA: Diagnosis not present

## 2013-04-15 DIAGNOSIS — S63509A Unspecified sprain of unspecified wrist, initial encounter: Secondary | ICD-10-CM | POA: Diagnosis not present

## 2013-04-15 DIAGNOSIS — M25539 Pain in unspecified wrist: Secondary | ICD-10-CM | POA: Diagnosis not present

## 2013-04-17 DIAGNOSIS — Z79899 Other long term (current) drug therapy: Secondary | ICD-10-CM | POA: Diagnosis not present

## 2013-04-17 DIAGNOSIS — Z86010 Personal history of colonic polyps: Secondary | ICD-10-CM | POA: Diagnosis not present

## 2013-04-17 DIAGNOSIS — E039 Hypothyroidism, unspecified: Secondary | ICD-10-CM | POA: Diagnosis not present

## 2013-04-17 DIAGNOSIS — K648 Other hemorrhoids: Secondary | ICD-10-CM | POA: Diagnosis not present

## 2013-04-17 DIAGNOSIS — I10 Essential (primary) hypertension: Secondary | ICD-10-CM | POA: Diagnosis not present

## 2013-04-17 DIAGNOSIS — K219 Gastro-esophageal reflux disease without esophagitis: Secondary | ICD-10-CM | POA: Diagnosis not present

## 2013-04-17 DIAGNOSIS — Z1211 Encounter for screening for malignant neoplasm of colon: Secondary | ICD-10-CM | POA: Diagnosis not present

## 2013-04-17 DIAGNOSIS — K5730 Diverticulosis of large intestine without perforation or abscess without bleeding: Secondary | ICD-10-CM | POA: Diagnosis not present

## 2013-04-17 DIAGNOSIS — E785 Hyperlipidemia, unspecified: Secondary | ICD-10-CM | POA: Diagnosis not present

## 2013-04-17 DIAGNOSIS — Z85038 Personal history of other malignant neoplasm of large intestine: Secondary | ICD-10-CM | POA: Diagnosis not present

## 2013-07-19 DIAGNOSIS — H26499 Other secondary cataract, unspecified eye: Secondary | ICD-10-CM | POA: Diagnosis not present

## 2013-08-25 DIAGNOSIS — H26499 Other secondary cataract, unspecified eye: Secondary | ICD-10-CM | POA: Diagnosis not present

## 2013-08-25 DIAGNOSIS — H02839 Dermatochalasis of unspecified eye, unspecified eyelid: Secondary | ICD-10-CM | POA: Diagnosis not present

## 2013-08-25 DIAGNOSIS — H18419 Arcus senilis, unspecified eye: Secondary | ICD-10-CM | POA: Diagnosis not present

## 2013-09-06 DIAGNOSIS — I10 Essential (primary) hypertension: Secondary | ICD-10-CM | POA: Diagnosis not present

## 2013-09-06 DIAGNOSIS — M4310 Spondylolisthesis, site unspecified: Secondary | ICD-10-CM | POA: Diagnosis not present

## 2013-09-08 DIAGNOSIS — H26499 Other secondary cataract, unspecified eye: Secondary | ICD-10-CM | POA: Diagnosis not present

## 2013-09-08 DIAGNOSIS — Z961 Presence of intraocular lens: Secondary | ICD-10-CM | POA: Diagnosis not present

## 2013-09-08 DIAGNOSIS — H02839 Dermatochalasis of unspecified eye, unspecified eyelid: Secondary | ICD-10-CM | POA: Diagnosis not present

## 2013-09-11 DIAGNOSIS — M4310 Spondylolisthesis, site unspecified: Secondary | ICD-10-CM | POA: Diagnosis not present

## 2013-09-25 DIAGNOSIS — E039 Hypothyroidism, unspecified: Secondary | ICD-10-CM | POA: Diagnosis not present

## 2013-09-25 DIAGNOSIS — E785 Hyperlipidemia, unspecified: Secondary | ICD-10-CM | POA: Diagnosis not present

## 2013-09-25 DIAGNOSIS — IMO0002 Reserved for concepts with insufficient information to code with codable children: Secondary | ICD-10-CM | POA: Diagnosis not present

## 2013-09-25 DIAGNOSIS — Z1231 Encounter for screening mammogram for malignant neoplasm of breast: Secondary | ICD-10-CM | POA: Diagnosis not present

## 2013-10-27 DIAGNOSIS — Z1231 Encounter for screening mammogram for malignant neoplasm of breast: Secondary | ICD-10-CM | POA: Diagnosis not present

## 2013-10-28 DIAGNOSIS — M4310 Spondylolisthesis, site unspecified: Secondary | ICD-10-CM | POA: Diagnosis not present

## 2014-02-11 DIAGNOSIS — Z23 Encounter for immunization: Secondary | ICD-10-CM | POA: Diagnosis not present

## 2014-09-27 DIAGNOSIS — R062 Wheezing: Secondary | ICD-10-CM | POA: Diagnosis not present

## 2014-09-27 DIAGNOSIS — Z1231 Encounter for screening mammogram for malignant neoplasm of breast: Secondary | ICD-10-CM | POA: Diagnosis not present

## 2014-09-27 DIAGNOSIS — E785 Hyperlipidemia, unspecified: Secondary | ICD-10-CM | POA: Diagnosis not present

## 2014-09-27 DIAGNOSIS — R5383 Other fatigue: Secondary | ICD-10-CM | POA: Diagnosis not present

## 2014-09-27 DIAGNOSIS — E039 Hypothyroidism, unspecified: Secondary | ICD-10-CM | POA: Diagnosis not present

## 2014-09-27 DIAGNOSIS — J40 Bronchitis, not specified as acute or chronic: Secondary | ICD-10-CM | POA: Diagnosis not present

## 2014-10-01 DIAGNOSIS — K760 Fatty (change of) liver, not elsewhere classified: Secondary | ICD-10-CM | POA: Diagnosis not present

## 2014-10-01 DIAGNOSIS — M7981 Nontraumatic hematoma of soft tissue: Secondary | ICD-10-CM | POA: Diagnosis not present

## 2014-10-01 DIAGNOSIS — R05 Cough: Secondary | ICD-10-CM | POA: Diagnosis not present

## 2014-10-01 DIAGNOSIS — K439 Ventral hernia without obstruction or gangrene: Secondary | ICD-10-CM | POA: Diagnosis not present

## 2014-10-01 DIAGNOSIS — R1031 Right lower quadrant pain: Secondary | ICD-10-CM | POA: Diagnosis not present

## 2014-10-01 DIAGNOSIS — R32 Unspecified urinary incontinence: Secondary | ICD-10-CM | POA: Diagnosis not present

## 2014-10-02 DIAGNOSIS — H35039 Hypertensive retinopathy, unspecified eye: Secondary | ICD-10-CM | POA: Diagnosis not present

## 2014-10-02 DIAGNOSIS — H02839 Dermatochalasis of unspecified eye, unspecified eyelid: Secondary | ICD-10-CM | POA: Diagnosis not present

## 2014-10-03 DIAGNOSIS — T148 Other injury of unspecified body region: Secondary | ICD-10-CM | POA: Diagnosis not present

## 2014-10-09 DIAGNOSIS — R1031 Right lower quadrant pain: Secondary | ICD-10-CM | POA: Diagnosis not present

## 2014-10-09 DIAGNOSIS — R05 Cough: Secondary | ICD-10-CM | POA: Diagnosis not present

## 2014-10-17 DIAGNOSIS — R9431 Abnormal electrocardiogram [ECG] [EKG]: Secondary | ICD-10-CM | POA: Diagnosis not present

## 2014-10-17 DIAGNOSIS — I10 Essential (primary) hypertension: Secondary | ICD-10-CM | POA: Diagnosis not present

## 2014-10-17 DIAGNOSIS — Z01818 Encounter for other preprocedural examination: Secondary | ICD-10-CM | POA: Diagnosis not present

## 2014-10-19 DIAGNOSIS — Z01810 Encounter for preprocedural cardiovascular examination: Secondary | ICD-10-CM | POA: Insufficient documentation

## 2014-10-19 DIAGNOSIS — Z7901 Long term (current) use of anticoagulants: Secondary | ICD-10-CM | POA: Insufficient documentation

## 2014-10-19 DIAGNOSIS — S301XXA Contusion of abdominal wall, initial encounter: Secondary | ICD-10-CM | POA: Insufficient documentation

## 2014-10-19 DIAGNOSIS — K432 Incisional hernia without obstruction or gangrene: Secondary | ICD-10-CM | POA: Insufficient documentation

## 2014-10-23 DIAGNOSIS — R9431 Abnormal electrocardiogram [ECG] [EKG]: Secondary | ICD-10-CM | POA: Diagnosis not present

## 2014-10-24 DIAGNOSIS — T148 Other injury of unspecified body region: Secondary | ICD-10-CM | POA: Diagnosis not present

## 2014-10-24 DIAGNOSIS — K432 Incisional hernia without obstruction or gangrene: Secondary | ICD-10-CM | POA: Diagnosis not present

## 2014-10-25 DIAGNOSIS — I083 Combined rheumatic disorders of mitral, aortic and tricuspid valves: Secondary | ICD-10-CM | POA: Diagnosis not present

## 2014-11-01 DIAGNOSIS — M25532 Pain in left wrist: Secondary | ICD-10-CM | POA: Diagnosis not present

## 2014-11-01 DIAGNOSIS — M7989 Other specified soft tissue disorders: Secondary | ICD-10-CM | POA: Diagnosis not present

## 2014-11-01 DIAGNOSIS — S6992XA Unspecified injury of left wrist, hand and finger(s), initial encounter: Secondary | ICD-10-CM | POA: Diagnosis not present

## 2014-11-02 DIAGNOSIS — Z1231 Encounter for screening mammogram for malignant neoplasm of breast: Secondary | ICD-10-CM | POA: Diagnosis not present

## 2014-11-09 DIAGNOSIS — K432 Incisional hernia without obstruction or gangrene: Secondary | ICD-10-CM | POA: Diagnosis not present

## 2014-11-09 DIAGNOSIS — K660 Peritoneal adhesions (postprocedural) (postinfection): Secondary | ICD-10-CM | POA: Diagnosis not present

## 2014-11-11 DIAGNOSIS — R339 Retention of urine, unspecified: Secondary | ICD-10-CM | POA: Diagnosis not present

## 2014-11-11 DIAGNOSIS — N3001 Acute cystitis with hematuria: Secondary | ICD-10-CM | POA: Diagnosis not present

## 2014-11-11 DIAGNOSIS — K913 Postprocedural intestinal obstruction: Secondary | ICD-10-CM | POA: Diagnosis not present

## 2014-11-11 DIAGNOSIS — D72825 Bandemia: Secondary | ICD-10-CM | POA: Diagnosis not present

## 2014-11-11 DIAGNOSIS — J9811 Atelectasis: Secondary | ICD-10-CM | POA: Diagnosis not present

## 2014-11-11 DIAGNOSIS — K661 Hemoperitoneum: Secondary | ICD-10-CM | POA: Diagnosis present

## 2014-11-11 DIAGNOSIS — K432 Incisional hernia without obstruction or gangrene: Secondary | ICD-10-CM | POA: Diagnosis present

## 2014-11-11 DIAGNOSIS — K219 Gastro-esophageal reflux disease without esophagitis: Secondary | ICD-10-CM | POA: Diagnosis present

## 2014-11-11 DIAGNOSIS — Z880 Allergy status to penicillin: Secondary | ICD-10-CM | POA: Diagnosis not present

## 2014-11-11 DIAGNOSIS — N390 Urinary tract infection, site not specified: Secondary | ICD-10-CM | POA: Diagnosis present

## 2014-11-11 DIAGNOSIS — N9989 Other postprocedural complications and disorders of genitourinary system: Secondary | ICD-10-CM | POA: Diagnosis not present

## 2014-11-11 DIAGNOSIS — J90 Pleural effusion, not elsewhere classified: Secondary | ICD-10-CM | POA: Diagnosis not present

## 2014-11-11 DIAGNOSIS — Z885 Allergy status to narcotic agent status: Secondary | ICD-10-CM | POA: Diagnosis not present

## 2014-11-11 DIAGNOSIS — J45909 Unspecified asthma, uncomplicated: Secondary | ICD-10-CM | POA: Diagnosis present

## 2014-11-11 DIAGNOSIS — E039 Hypothyroidism, unspecified: Secondary | ICD-10-CM | POA: Diagnosis not present

## 2014-11-11 DIAGNOSIS — R140 Abdominal distension (gaseous): Secondary | ICD-10-CM | POA: Diagnosis not present

## 2014-11-11 DIAGNOSIS — I9789 Other postprocedural complications and disorders of the circulatory system, not elsewhere classified: Secondary | ICD-10-CM | POA: Diagnosis not present

## 2014-11-11 DIAGNOSIS — E876 Hypokalemia: Secondary | ICD-10-CM | POA: Diagnosis not present

## 2014-11-11 DIAGNOSIS — Z86711 Personal history of pulmonary embolism: Secondary | ICD-10-CM | POA: Diagnosis not present

## 2014-11-11 DIAGNOSIS — Z79899 Other long term (current) drug therapy: Secondary | ICD-10-CM | POA: Diagnosis not present

## 2014-11-11 DIAGNOSIS — G8918 Other acute postprocedural pain: Secondary | ICD-10-CM | POA: Diagnosis not present

## 2014-11-11 DIAGNOSIS — E871 Hypo-osmolality and hyponatremia: Secondary | ICD-10-CM | POA: Diagnosis not present

## 2014-11-11 DIAGNOSIS — D72829 Elevated white blood cell count, unspecified: Secondary | ICD-10-CM | POA: Diagnosis not present

## 2014-11-11 DIAGNOSIS — R000 Tachycardia, unspecified: Secondary | ICD-10-CM | POA: Diagnosis not present

## 2014-11-21 DIAGNOSIS — E039 Hypothyroidism, unspecified: Secondary | ICD-10-CM | POA: Diagnosis present

## 2014-11-21 DIAGNOSIS — K760 Fatty (change of) liver, not elsewhere classified: Secondary | ICD-10-CM | POA: Diagnosis not present

## 2014-11-21 DIAGNOSIS — R0602 Shortness of breath: Secondary | ICD-10-CM | POA: Diagnosis not present

## 2014-11-21 DIAGNOSIS — N3289 Other specified disorders of bladder: Secondary | ICD-10-CM | POA: Diagnosis not present

## 2014-11-21 DIAGNOSIS — K91840 Postprocedural hemorrhage and hematoma of a digestive system organ or structure following a digestive system procedure: Secondary | ICD-10-CM | POA: Diagnosis not present

## 2014-11-21 DIAGNOSIS — I10 Essential (primary) hypertension: Secondary | ICD-10-CM | POA: Diagnosis not present

## 2014-11-21 DIAGNOSIS — T8579XA Infection and inflammatory reaction due to other internal prosthetic devices, implants and grafts, initial encounter: Secondary | ICD-10-CM | POA: Diagnosis not present

## 2014-11-21 DIAGNOSIS — K567 Ileus, unspecified: Secondary | ICD-10-CM | POA: Diagnosis not present

## 2014-11-21 DIAGNOSIS — Z79899 Other long term (current) drug therapy: Secondary | ICD-10-CM | POA: Diagnosis not present

## 2014-11-21 DIAGNOSIS — T814XXA Infection following a procedure, initial encounter: Secondary | ICD-10-CM | POA: Diagnosis not present

## 2014-11-21 DIAGNOSIS — R079 Chest pain, unspecified: Secondary | ICD-10-CM | POA: Diagnosis not present

## 2014-11-21 DIAGNOSIS — Z880 Allergy status to penicillin: Secondary | ICD-10-CM | POA: Diagnosis not present

## 2014-11-21 DIAGNOSIS — Z885 Allergy status to narcotic agent status: Secondary | ICD-10-CM | POA: Diagnosis not present

## 2014-11-21 DIAGNOSIS — J90 Pleural effusion, not elsewhere classified: Secondary | ICD-10-CM | POA: Diagnosis not present

## 2014-11-21 DIAGNOSIS — M069 Rheumatoid arthritis, unspecified: Secondary | ICD-10-CM | POA: Diagnosis present

## 2014-11-21 DIAGNOSIS — K632 Fistula of intestine: Secondary | ICD-10-CM | POA: Diagnosis present

## 2014-11-21 DIAGNOSIS — Z86711 Personal history of pulmonary embolism: Secondary | ICD-10-CM | POA: Diagnosis not present

## 2014-11-21 DIAGNOSIS — M1990 Unspecified osteoarthritis, unspecified site: Secondary | ICD-10-CM | POA: Diagnosis present

## 2014-11-21 DIAGNOSIS — R109 Unspecified abdominal pain: Secondary | ICD-10-CM | POA: Diagnosis not present

## 2014-11-21 DIAGNOSIS — R188 Other ascites: Secondary | ICD-10-CM | POA: Diagnosis not present

## 2014-11-21 DIAGNOSIS — S30851A Superficial foreign body of abdominal wall, initial encounter: Secondary | ICD-10-CM | POA: Diagnosis not present

## 2014-11-21 DIAGNOSIS — K651 Peritoneal abscess: Secondary | ICD-10-CM | POA: Diagnosis not present

## 2014-11-21 DIAGNOSIS — J9811 Atelectasis: Secondary | ICD-10-CM | POA: Diagnosis not present

## 2014-11-22 ENCOUNTER — Other Ambulatory Visit: Payer: Self-pay

## 2014-11-22 DIAGNOSIS — S30851A Superficial foreign body of abdominal wall, initial encounter: Secondary | ICD-10-CM | POA: Diagnosis not present

## 2014-12-05 DIAGNOSIS — K6811 Postprocedural retroperitoneal abscess: Secondary | ICD-10-CM | POA: Diagnosis not present

## 2014-12-05 DIAGNOSIS — M069 Rheumatoid arthritis, unspecified: Secondary | ICD-10-CM | POA: Diagnosis not present

## 2014-12-05 DIAGNOSIS — E039 Hypothyroidism, unspecified: Secondary | ICD-10-CM | POA: Diagnosis not present

## 2014-12-05 DIAGNOSIS — J45909 Unspecified asthma, uncomplicated: Secondary | ICD-10-CM | POA: Diagnosis not present

## 2014-12-05 DIAGNOSIS — I10 Essential (primary) hypertension: Secondary | ICD-10-CM | POA: Diagnosis not present

## 2014-12-07 DIAGNOSIS — M069 Rheumatoid arthritis, unspecified: Secondary | ICD-10-CM | POA: Diagnosis not present

## 2014-12-07 DIAGNOSIS — E039 Hypothyroidism, unspecified: Secondary | ICD-10-CM | POA: Diagnosis not present

## 2014-12-07 DIAGNOSIS — I10 Essential (primary) hypertension: Secondary | ICD-10-CM | POA: Diagnosis not present

## 2014-12-07 DIAGNOSIS — J45909 Unspecified asthma, uncomplicated: Secondary | ICD-10-CM | POA: Diagnosis not present

## 2014-12-07 DIAGNOSIS — K6811 Postprocedural retroperitoneal abscess: Secondary | ICD-10-CM | POA: Diagnosis not present

## 2014-12-09 DIAGNOSIS — E039 Hypothyroidism, unspecified: Secondary | ICD-10-CM | POA: Diagnosis not present

## 2014-12-09 DIAGNOSIS — J45909 Unspecified asthma, uncomplicated: Secondary | ICD-10-CM | POA: Diagnosis not present

## 2014-12-09 DIAGNOSIS — K6811 Postprocedural retroperitoneal abscess: Secondary | ICD-10-CM | POA: Diagnosis not present

## 2014-12-09 DIAGNOSIS — M069 Rheumatoid arthritis, unspecified: Secondary | ICD-10-CM | POA: Diagnosis not present

## 2014-12-09 DIAGNOSIS — I10 Essential (primary) hypertension: Secondary | ICD-10-CM | POA: Diagnosis not present

## 2014-12-10 DIAGNOSIS — E039 Hypothyroidism, unspecified: Secondary | ICD-10-CM | POA: Diagnosis not present

## 2014-12-10 DIAGNOSIS — J45909 Unspecified asthma, uncomplicated: Secondary | ICD-10-CM | POA: Diagnosis not present

## 2014-12-10 DIAGNOSIS — M069 Rheumatoid arthritis, unspecified: Secondary | ICD-10-CM | POA: Diagnosis not present

## 2014-12-10 DIAGNOSIS — I10 Essential (primary) hypertension: Secondary | ICD-10-CM | POA: Diagnosis not present

## 2014-12-10 DIAGNOSIS — K6811 Postprocedural retroperitoneal abscess: Secondary | ICD-10-CM | POA: Diagnosis not present

## 2014-12-12 DIAGNOSIS — M069 Rheumatoid arthritis, unspecified: Secondary | ICD-10-CM | POA: Diagnosis not present

## 2014-12-12 DIAGNOSIS — J45909 Unspecified asthma, uncomplicated: Secondary | ICD-10-CM | POA: Diagnosis not present

## 2014-12-12 DIAGNOSIS — E039 Hypothyroidism, unspecified: Secondary | ICD-10-CM | POA: Diagnosis not present

## 2014-12-12 DIAGNOSIS — I10 Essential (primary) hypertension: Secondary | ICD-10-CM | POA: Diagnosis not present

## 2014-12-12 DIAGNOSIS — K6811 Postprocedural retroperitoneal abscess: Secondary | ICD-10-CM | POA: Diagnosis not present

## 2014-12-13 DIAGNOSIS — K6811 Postprocedural retroperitoneal abscess: Secondary | ICD-10-CM | POA: Diagnosis not present

## 2014-12-13 DIAGNOSIS — E039 Hypothyroidism, unspecified: Secondary | ICD-10-CM | POA: Diagnosis not present

## 2014-12-13 DIAGNOSIS — J45909 Unspecified asthma, uncomplicated: Secondary | ICD-10-CM | POA: Diagnosis not present

## 2014-12-13 DIAGNOSIS — M069 Rheumatoid arthritis, unspecified: Secondary | ICD-10-CM | POA: Diagnosis not present

## 2014-12-13 DIAGNOSIS — I10 Essential (primary) hypertension: Secondary | ICD-10-CM | POA: Diagnosis not present

## 2014-12-13 DIAGNOSIS — R109 Unspecified abdominal pain: Secondary | ICD-10-CM | POA: Diagnosis not present

## 2014-12-14 DIAGNOSIS — K6811 Postprocedural retroperitoneal abscess: Secondary | ICD-10-CM | POA: Diagnosis not present

## 2014-12-14 DIAGNOSIS — M069 Rheumatoid arthritis, unspecified: Secondary | ICD-10-CM | POA: Diagnosis not present

## 2014-12-14 DIAGNOSIS — J9811 Atelectasis: Secondary | ICD-10-CM | POA: Diagnosis not present

## 2014-12-14 DIAGNOSIS — I10 Essential (primary) hypertension: Secondary | ICD-10-CM | POA: Diagnosis not present

## 2014-12-14 DIAGNOSIS — J90 Pleural effusion, not elsewhere classified: Secondary | ICD-10-CM | POA: Diagnosis not present

## 2014-12-14 DIAGNOSIS — E039 Hypothyroidism, unspecified: Secondary | ICD-10-CM | POA: Diagnosis not present

## 2014-12-14 DIAGNOSIS — R109 Unspecified abdominal pain: Secondary | ICD-10-CM | POA: Diagnosis not present

## 2014-12-14 DIAGNOSIS — J45909 Unspecified asthma, uncomplicated: Secondary | ICD-10-CM | POA: Diagnosis not present

## 2014-12-14 DIAGNOSIS — K760 Fatty (change of) liver, not elsewhere classified: Secondary | ICD-10-CM | POA: Diagnosis not present

## 2014-12-17 DIAGNOSIS — K6811 Postprocedural retroperitoneal abscess: Secondary | ICD-10-CM | POA: Diagnosis not present

## 2014-12-17 DIAGNOSIS — E039 Hypothyroidism, unspecified: Secondary | ICD-10-CM | POA: Diagnosis not present

## 2014-12-17 DIAGNOSIS — I10 Essential (primary) hypertension: Secondary | ICD-10-CM | POA: Diagnosis not present

## 2014-12-17 DIAGNOSIS — M069 Rheumatoid arthritis, unspecified: Secondary | ICD-10-CM | POA: Diagnosis not present

## 2014-12-17 DIAGNOSIS — J45909 Unspecified asthma, uncomplicated: Secondary | ICD-10-CM | POA: Diagnosis not present

## 2014-12-19 DIAGNOSIS — K6811 Postprocedural retroperitoneal abscess: Secondary | ICD-10-CM | POA: Diagnosis not present

## 2014-12-19 DIAGNOSIS — I10 Essential (primary) hypertension: Secondary | ICD-10-CM | POA: Diagnosis not present

## 2014-12-19 DIAGNOSIS — M069 Rheumatoid arthritis, unspecified: Secondary | ICD-10-CM | POA: Diagnosis not present

## 2014-12-19 DIAGNOSIS — E039 Hypothyroidism, unspecified: Secondary | ICD-10-CM | POA: Diagnosis not present

## 2014-12-19 DIAGNOSIS — J45909 Unspecified asthma, uncomplicated: Secondary | ICD-10-CM | POA: Diagnosis not present

## 2014-12-21 DIAGNOSIS — M069 Rheumatoid arthritis, unspecified: Secondary | ICD-10-CM | POA: Diagnosis not present

## 2014-12-21 DIAGNOSIS — E039 Hypothyroidism, unspecified: Secondary | ICD-10-CM | POA: Diagnosis not present

## 2014-12-21 DIAGNOSIS — I10 Essential (primary) hypertension: Secondary | ICD-10-CM | POA: Diagnosis not present

## 2014-12-21 DIAGNOSIS — J45909 Unspecified asthma, uncomplicated: Secondary | ICD-10-CM | POA: Diagnosis not present

## 2014-12-21 DIAGNOSIS — K6811 Postprocedural retroperitoneal abscess: Secondary | ICD-10-CM | POA: Diagnosis not present

## 2014-12-24 DIAGNOSIS — M069 Rheumatoid arthritis, unspecified: Secondary | ICD-10-CM | POA: Diagnosis not present

## 2014-12-24 DIAGNOSIS — E039 Hypothyroidism, unspecified: Secondary | ICD-10-CM | POA: Diagnosis not present

## 2014-12-24 DIAGNOSIS — K6811 Postprocedural retroperitoneal abscess: Secondary | ICD-10-CM | POA: Diagnosis not present

## 2014-12-24 DIAGNOSIS — I10 Essential (primary) hypertension: Secondary | ICD-10-CM | POA: Diagnosis not present

## 2014-12-24 DIAGNOSIS — J45909 Unspecified asthma, uncomplicated: Secondary | ICD-10-CM | POA: Diagnosis not present

## 2014-12-26 DIAGNOSIS — I10 Essential (primary) hypertension: Secondary | ICD-10-CM | POA: Diagnosis not present

## 2014-12-26 DIAGNOSIS — M069 Rheumatoid arthritis, unspecified: Secondary | ICD-10-CM | POA: Diagnosis not present

## 2014-12-26 DIAGNOSIS — J45909 Unspecified asthma, uncomplicated: Secondary | ICD-10-CM | POA: Diagnosis not present

## 2014-12-26 DIAGNOSIS — E039 Hypothyroidism, unspecified: Secondary | ICD-10-CM | POA: Diagnosis not present

## 2014-12-26 DIAGNOSIS — K6811 Postprocedural retroperitoneal abscess: Secondary | ICD-10-CM | POA: Diagnosis not present

## 2014-12-28 DIAGNOSIS — J45909 Unspecified asthma, uncomplicated: Secondary | ICD-10-CM | POA: Diagnosis not present

## 2014-12-28 DIAGNOSIS — K6811 Postprocedural retroperitoneal abscess: Secondary | ICD-10-CM | POA: Diagnosis not present

## 2014-12-28 DIAGNOSIS — I10 Essential (primary) hypertension: Secondary | ICD-10-CM | POA: Diagnosis not present

## 2014-12-28 DIAGNOSIS — M069 Rheumatoid arthritis, unspecified: Secondary | ICD-10-CM | POA: Diagnosis not present

## 2014-12-28 DIAGNOSIS — E039 Hypothyroidism, unspecified: Secondary | ICD-10-CM | POA: Diagnosis not present

## 2014-12-31 DIAGNOSIS — E039 Hypothyroidism, unspecified: Secondary | ICD-10-CM | POA: Diagnosis not present

## 2014-12-31 DIAGNOSIS — M069 Rheumatoid arthritis, unspecified: Secondary | ICD-10-CM | POA: Diagnosis not present

## 2014-12-31 DIAGNOSIS — J45909 Unspecified asthma, uncomplicated: Secondary | ICD-10-CM | POA: Diagnosis not present

## 2014-12-31 DIAGNOSIS — I10 Essential (primary) hypertension: Secondary | ICD-10-CM | POA: Diagnosis not present

## 2014-12-31 DIAGNOSIS — K6811 Postprocedural retroperitoneal abscess: Secondary | ICD-10-CM | POA: Diagnosis not present

## 2015-01-02 DIAGNOSIS — J45909 Unspecified asthma, uncomplicated: Secondary | ICD-10-CM | POA: Diagnosis not present

## 2015-01-02 DIAGNOSIS — K6811 Postprocedural retroperitoneal abscess: Secondary | ICD-10-CM | POA: Diagnosis not present

## 2015-01-02 DIAGNOSIS — M069 Rheumatoid arthritis, unspecified: Secondary | ICD-10-CM | POA: Diagnosis not present

## 2015-01-02 DIAGNOSIS — E039 Hypothyroidism, unspecified: Secondary | ICD-10-CM | POA: Diagnosis not present

## 2015-01-02 DIAGNOSIS — I10 Essential (primary) hypertension: Secondary | ICD-10-CM | POA: Diagnosis not present

## 2015-01-04 DIAGNOSIS — J45909 Unspecified asthma, uncomplicated: Secondary | ICD-10-CM | POA: Diagnosis not present

## 2015-01-04 DIAGNOSIS — M069 Rheumatoid arthritis, unspecified: Secondary | ICD-10-CM | POA: Diagnosis not present

## 2015-01-04 DIAGNOSIS — E039 Hypothyroidism, unspecified: Secondary | ICD-10-CM | POA: Diagnosis not present

## 2015-01-04 DIAGNOSIS — I10 Essential (primary) hypertension: Secondary | ICD-10-CM | POA: Diagnosis not present

## 2015-01-04 DIAGNOSIS — K6811 Postprocedural retroperitoneal abscess: Secondary | ICD-10-CM | POA: Diagnosis not present

## 2015-01-07 DIAGNOSIS — K6811 Postprocedural retroperitoneal abscess: Secondary | ICD-10-CM | POA: Diagnosis not present

## 2015-01-07 DIAGNOSIS — M069 Rheumatoid arthritis, unspecified: Secondary | ICD-10-CM | POA: Diagnosis not present

## 2015-01-07 DIAGNOSIS — I10 Essential (primary) hypertension: Secondary | ICD-10-CM | POA: Diagnosis not present

## 2015-01-07 DIAGNOSIS — J45909 Unspecified asthma, uncomplicated: Secondary | ICD-10-CM | POA: Diagnosis not present

## 2015-01-07 DIAGNOSIS — E039 Hypothyroidism, unspecified: Secondary | ICD-10-CM | POA: Diagnosis not present

## 2015-01-11 DIAGNOSIS — J45909 Unspecified asthma, uncomplicated: Secondary | ICD-10-CM | POA: Diagnosis not present

## 2015-01-11 DIAGNOSIS — E039 Hypothyroidism, unspecified: Secondary | ICD-10-CM | POA: Diagnosis not present

## 2015-01-11 DIAGNOSIS — M069 Rheumatoid arthritis, unspecified: Secondary | ICD-10-CM | POA: Diagnosis not present

## 2015-01-11 DIAGNOSIS — I10 Essential (primary) hypertension: Secondary | ICD-10-CM | POA: Diagnosis not present

## 2015-01-11 DIAGNOSIS — K6811 Postprocedural retroperitoneal abscess: Secondary | ICD-10-CM | POA: Diagnosis not present

## 2015-01-16 DIAGNOSIS — I10 Essential (primary) hypertension: Secondary | ICD-10-CM | POA: Diagnosis not present

## 2015-01-16 DIAGNOSIS — J45909 Unspecified asthma, uncomplicated: Secondary | ICD-10-CM | POA: Diagnosis not present

## 2015-01-16 DIAGNOSIS — K6811 Postprocedural retroperitoneal abscess: Secondary | ICD-10-CM | POA: Diagnosis not present

## 2015-01-16 DIAGNOSIS — E039 Hypothyroidism, unspecified: Secondary | ICD-10-CM | POA: Diagnosis not present

## 2015-01-16 DIAGNOSIS — M069 Rheumatoid arthritis, unspecified: Secondary | ICD-10-CM | POA: Diagnosis not present

## 2015-01-25 DIAGNOSIS — K6811 Postprocedural retroperitoneal abscess: Secondary | ICD-10-CM | POA: Diagnosis not present

## 2015-01-25 DIAGNOSIS — I10 Essential (primary) hypertension: Secondary | ICD-10-CM | POA: Diagnosis not present

## 2015-01-25 DIAGNOSIS — E039 Hypothyroidism, unspecified: Secondary | ICD-10-CM | POA: Diagnosis not present

## 2015-01-25 DIAGNOSIS — M069 Rheumatoid arthritis, unspecified: Secondary | ICD-10-CM | POA: Diagnosis not present

## 2015-01-25 DIAGNOSIS — J45909 Unspecified asthma, uncomplicated: Secondary | ICD-10-CM | POA: Diagnosis not present

## 2015-04-10 DIAGNOSIS — R748 Abnormal levels of other serum enzymes: Secondary | ICD-10-CM | POA: Diagnosis not present

## 2015-04-17 DIAGNOSIS — Z23 Encounter for immunization: Secondary | ICD-10-CM | POA: Diagnosis not present

## 2015-05-01 DIAGNOSIS — J209 Acute bronchitis, unspecified: Secondary | ICD-10-CM | POA: Diagnosis not present

## 2015-06-02 DIAGNOSIS — Z23 Encounter for immunization: Secondary | ICD-10-CM | POA: Diagnosis not present

## 2015-06-21 DIAGNOSIS — D485 Neoplasm of uncertain behavior of skin: Secondary | ICD-10-CM | POA: Diagnosis not present

## 2015-06-21 DIAGNOSIS — C44622 Squamous cell carcinoma of skin of right upper limb, including shoulder: Secondary | ICD-10-CM | POA: Diagnosis not present

## 2015-06-21 DIAGNOSIS — L821 Other seborrheic keratosis: Secondary | ICD-10-CM | POA: Diagnosis not present

## 2015-06-21 DIAGNOSIS — L578 Other skin changes due to chronic exposure to nonionizing radiation: Secondary | ICD-10-CM | POA: Diagnosis not present

## 2015-06-21 DIAGNOSIS — L820 Inflamed seborrheic keratosis: Secondary | ICD-10-CM | POA: Diagnosis not present

## 2015-07-25 DIAGNOSIS — R109 Unspecified abdominal pain: Secondary | ICD-10-CM | POA: Diagnosis not present

## 2015-07-25 DIAGNOSIS — R079 Chest pain, unspecified: Secondary | ICD-10-CM | POA: Diagnosis not present

## 2015-07-25 DIAGNOSIS — N644 Mastodynia: Secondary | ICD-10-CM | POA: Diagnosis not present

## 2015-07-25 DIAGNOSIS — R350 Frequency of micturition: Secondary | ICD-10-CM | POA: Diagnosis not present

## 2015-08-02 DIAGNOSIS — R109 Unspecified abdominal pain: Secondary | ICD-10-CM | POA: Diagnosis not present

## 2015-08-02 DIAGNOSIS — K439 Ventral hernia without obstruction or gangrene: Secondary | ICD-10-CM | POA: Diagnosis not present

## 2015-08-02 DIAGNOSIS — K760 Fatty (change of) liver, not elsewhere classified: Secondary | ICD-10-CM | POA: Diagnosis not present

## 2015-08-02 DIAGNOSIS — Z79899 Other long term (current) drug therapy: Secondary | ICD-10-CM | POA: Diagnosis not present

## 2015-08-05 DIAGNOSIS — N644 Mastodynia: Secondary | ICD-10-CM | POA: Diagnosis not present

## 2015-08-21 DIAGNOSIS — R1011 Right upper quadrant pain: Secondary | ICD-10-CM | POA: Insufficient documentation

## 2015-08-24 DIAGNOSIS — R1011 Right upper quadrant pain: Secondary | ICD-10-CM | POA: Diagnosis not present

## 2015-08-24 DIAGNOSIS — K760 Fatty (change of) liver, not elsewhere classified: Secondary | ICD-10-CM | POA: Diagnosis not present

## 2015-08-26 DIAGNOSIS — R079 Chest pain, unspecified: Secondary | ICD-10-CM | POA: Diagnosis not present

## 2015-08-26 DIAGNOSIS — I2510 Atherosclerotic heart disease of native coronary artery without angina pectoris: Secondary | ICD-10-CM | POA: Diagnosis not present

## 2015-08-26 DIAGNOSIS — R1011 Right upper quadrant pain: Secondary | ICD-10-CM | POA: Diagnosis not present

## 2015-08-26 DIAGNOSIS — Z86711 Personal history of pulmonary embolism: Secondary | ICD-10-CM | POA: Diagnosis not present

## 2015-08-26 DIAGNOSIS — I700 Atherosclerosis of aorta: Secondary | ICD-10-CM | POA: Diagnosis not present

## 2015-09-09 DIAGNOSIS — E785 Hyperlipidemia, unspecified: Secondary | ICD-10-CM | POA: Diagnosis not present

## 2015-09-09 DIAGNOSIS — E611 Iron deficiency: Secondary | ICD-10-CM | POA: Diagnosis not present

## 2015-09-09 DIAGNOSIS — I10 Essential (primary) hypertension: Secondary | ICD-10-CM | POA: Diagnosis not present

## 2015-09-09 DIAGNOSIS — R109 Unspecified abdominal pain: Secondary | ICD-10-CM | POA: Diagnosis not present

## 2015-09-09 DIAGNOSIS — E039 Hypothyroidism, unspecified: Secondary | ICD-10-CM | POA: Diagnosis not present

## 2015-09-12 DIAGNOSIS — R030 Elevated blood-pressure reading, without diagnosis of hypertension: Secondary | ICD-10-CM | POA: Diagnosis not present

## 2015-09-18 DIAGNOSIS — R1011 Right upper quadrant pain: Secondary | ICD-10-CM | POA: Diagnosis not present

## 2015-09-27 DIAGNOSIS — Z01810 Encounter for preprocedural cardiovascular examination: Secondary | ICD-10-CM | POA: Diagnosis not present

## 2015-09-27 DIAGNOSIS — Z01818 Encounter for other preprocedural examination: Secondary | ICD-10-CM | POA: Diagnosis not present

## 2015-09-27 DIAGNOSIS — I10 Essential (primary) hypertension: Secondary | ICD-10-CM | POA: Diagnosis not present

## 2015-09-27 DIAGNOSIS — Z0000 Encounter for general adult medical examination without abnormal findings: Secondary | ICD-10-CM | POA: Diagnosis not present

## 2015-09-30 DIAGNOSIS — K430 Incisional hernia with obstruction, without gangrene: Secondary | ICD-10-CM | POA: Diagnosis not present

## 2015-09-30 DIAGNOSIS — Z79899 Other long term (current) drug therapy: Secondary | ICD-10-CM | POA: Diagnosis not present

## 2015-09-30 DIAGNOSIS — I10 Essential (primary) hypertension: Secondary | ICD-10-CM | POA: Diagnosis not present

## 2015-09-30 DIAGNOSIS — N390 Urinary tract infection, site not specified: Secondary | ICD-10-CM | POA: Diagnosis not present

## 2015-10-01 DIAGNOSIS — K430 Incisional hernia with obstruction, without gangrene: Secondary | ICD-10-CM | POA: Diagnosis not present

## 2015-10-01 DIAGNOSIS — I10 Essential (primary) hypertension: Secondary | ICD-10-CM | POA: Diagnosis not present

## 2015-10-01 DIAGNOSIS — Z79899 Other long term (current) drug therapy: Secondary | ICD-10-CM | POA: Diagnosis not present

## 2015-10-01 DIAGNOSIS — N390 Urinary tract infection, site not specified: Secondary | ICD-10-CM | POA: Diagnosis not present

## 2015-10-02 DIAGNOSIS — N390 Urinary tract infection, site not specified: Secondary | ICD-10-CM | POA: Diagnosis not present

## 2015-10-02 DIAGNOSIS — R509 Fever, unspecified: Secondary | ICD-10-CM | POA: Diagnosis not present

## 2015-10-02 DIAGNOSIS — Z79899 Other long term (current) drug therapy: Secondary | ICD-10-CM | POA: Diagnosis not present

## 2015-10-02 DIAGNOSIS — I10 Essential (primary) hypertension: Secondary | ICD-10-CM | POA: Diagnosis not present

## 2015-10-02 DIAGNOSIS — K430 Incisional hernia with obstruction, without gangrene: Secondary | ICD-10-CM | POA: Diagnosis not present

## 2015-10-16 DIAGNOSIS — Z09 Encounter for follow-up examination after completed treatment for conditions other than malignant neoplasm: Secondary | ICD-10-CM | POA: Insufficient documentation

## 2015-10-25 DIAGNOSIS — N390 Urinary tract infection, site not specified: Secondary | ICD-10-CM | POA: Diagnosis not present

## 2015-10-25 DIAGNOSIS — N3001 Acute cystitis with hematuria: Secondary | ICD-10-CM | POA: Diagnosis not present

## 2015-10-30 DIAGNOSIS — N390 Urinary tract infection, site not specified: Secondary | ICD-10-CM | POA: Diagnosis not present

## 2015-11-01 DIAGNOSIS — H02839 Dermatochalasis of unspecified eye, unspecified eyelid: Secondary | ICD-10-CM | POA: Diagnosis not present

## 2015-11-01 DIAGNOSIS — H5202 Hypermetropia, left eye: Secondary | ICD-10-CM | POA: Diagnosis not present

## 2015-11-01 DIAGNOSIS — H35039 Hypertensive retinopathy, unspecified eye: Secondary | ICD-10-CM | POA: Diagnosis not present

## 2015-11-06 DIAGNOSIS — Z1231 Encounter for screening mammogram for malignant neoplasm of breast: Secondary | ICD-10-CM | POA: Diagnosis not present

## 2015-11-06 DIAGNOSIS — E039 Hypothyroidism, unspecified: Secondary | ICD-10-CM | POA: Diagnosis not present

## 2015-11-06 DIAGNOSIS — N390 Urinary tract infection, site not specified: Secondary | ICD-10-CM | POA: Diagnosis not present

## 2015-11-07 DIAGNOSIS — Z1231 Encounter for screening mammogram for malignant neoplasm of breast: Secondary | ICD-10-CM | POA: Diagnosis not present

## 2015-12-23 DIAGNOSIS — R300 Dysuria: Secondary | ICD-10-CM | POA: Diagnosis not present

## 2015-12-23 DIAGNOSIS — N390 Urinary tract infection, site not specified: Secondary | ICD-10-CM | POA: Diagnosis not present

## 2016-01-07 ENCOUNTER — Other Ambulatory Visit: Payer: Self-pay

## 2016-02-09 DIAGNOSIS — Z23 Encounter for immunization: Secondary | ICD-10-CM | POA: Diagnosis not present

## 2016-06-06 DIAGNOSIS — Z23 Encounter for immunization: Secondary | ICD-10-CM | POA: Diagnosis not present

## 2016-07-03 DIAGNOSIS — J111 Influenza due to unidentified influenza virus with other respiratory manifestations: Secondary | ICD-10-CM | POA: Diagnosis not present

## 2016-07-30 DIAGNOSIS — Z1231 Encounter for screening mammogram for malignant neoplasm of breast: Secondary | ICD-10-CM | POA: Diagnosis not present

## 2016-07-30 DIAGNOSIS — R05 Cough: Secondary | ICD-10-CM | POA: Diagnosis not present

## 2016-09-30 DIAGNOSIS — Z0000 Encounter for general adult medical examination without abnormal findings: Secondary | ICD-10-CM | POA: Diagnosis not present

## 2016-09-30 DIAGNOSIS — E119 Type 2 diabetes mellitus without complications: Secondary | ICD-10-CM | POA: Diagnosis not present

## 2016-09-30 DIAGNOSIS — Z13220 Encounter for screening for lipoid disorders: Secondary | ICD-10-CM | POA: Diagnosis not present

## 2016-09-30 DIAGNOSIS — Z1389 Encounter for screening for other disorder: Secondary | ICD-10-CM | POA: Diagnosis not present

## 2016-09-30 DIAGNOSIS — Z131 Encounter for screening for diabetes mellitus: Secondary | ICD-10-CM | POA: Diagnosis not present

## 2016-09-30 DIAGNOSIS — E039 Hypothyroidism, unspecified: Secondary | ICD-10-CM | POA: Diagnosis not present

## 2016-09-30 DIAGNOSIS — Z780 Asymptomatic menopausal state: Secondary | ICD-10-CM | POA: Diagnosis not present

## 2016-10-02 DIAGNOSIS — R319 Hematuria, unspecified: Secondary | ICD-10-CM | POA: Diagnosis not present

## 2016-10-07 DIAGNOSIS — Z780 Asymptomatic menopausal state: Secondary | ICD-10-CM | POA: Diagnosis not present

## 2016-11-09 DIAGNOSIS — Z1231 Encounter for screening mammogram for malignant neoplasm of breast: Secondary | ICD-10-CM | POA: Diagnosis not present

## 2016-11-12 DIAGNOSIS — H524 Presbyopia: Secondary | ICD-10-CM | POA: Diagnosis not present

## 2016-11-12 DIAGNOSIS — H02839 Dermatochalasis of unspecified eye, unspecified eyelid: Secondary | ICD-10-CM | POA: Diagnosis not present

## 2016-12-01 DIAGNOSIS — L570 Actinic keratosis: Secondary | ICD-10-CM | POA: Diagnosis not present

## 2016-12-01 DIAGNOSIS — C44622 Squamous cell carcinoma of skin of right upper limb, including shoulder: Secondary | ICD-10-CM | POA: Diagnosis not present

## 2016-12-01 DIAGNOSIS — L820 Inflamed seborrheic keratosis: Secondary | ICD-10-CM | POA: Diagnosis not present

## 2016-12-01 DIAGNOSIS — L578 Other skin changes due to chronic exposure to nonionizing radiation: Secondary | ICD-10-CM | POA: Diagnosis not present

## 2016-12-01 DIAGNOSIS — L728 Other follicular cysts of the skin and subcutaneous tissue: Secondary | ICD-10-CM | POA: Diagnosis not present

## 2017-01-04 DIAGNOSIS — E7800 Pure hypercholesterolemia, unspecified: Secondary | ICD-10-CM | POA: Diagnosis not present

## 2017-01-04 DIAGNOSIS — L820 Inflamed seborrheic keratosis: Secondary | ICD-10-CM | POA: Diagnosis not present

## 2017-01-04 DIAGNOSIS — L570 Actinic keratosis: Secondary | ICD-10-CM | POA: Diagnosis not present

## 2017-02-14 DIAGNOSIS — Z23 Encounter for immunization: Secondary | ICD-10-CM | POA: Diagnosis not present

## 2017-03-02 DIAGNOSIS — J209 Acute bronchitis, unspecified: Secondary | ICD-10-CM | POA: Diagnosis not present

## 2017-03-24 DIAGNOSIS — J0100 Acute maxillary sinusitis, unspecified: Secondary | ICD-10-CM | POA: Diagnosis not present

## 2017-07-03 DIAGNOSIS — J309 Allergic rhinitis, unspecified: Secondary | ICD-10-CM | POA: Diagnosis not present

## 2017-10-25 DIAGNOSIS — E785 Hyperlipidemia, unspecified: Secondary | ICD-10-CM | POA: Diagnosis not present

## 2017-10-25 DIAGNOSIS — Z1389 Encounter for screening for other disorder: Secondary | ICD-10-CM | POA: Diagnosis not present

## 2017-10-25 DIAGNOSIS — Z1231 Encounter for screening mammogram for malignant neoplasm of breast: Secondary | ICD-10-CM | POA: Diagnosis not present

## 2017-10-25 DIAGNOSIS — Z0001 Encounter for general adult medical examination with abnormal findings: Secondary | ICD-10-CM | POA: Diagnosis not present

## 2017-10-25 DIAGNOSIS — E039 Hypothyroidism, unspecified: Secondary | ICD-10-CM | POA: Diagnosis not present

## 2017-10-25 DIAGNOSIS — Z79899 Other long term (current) drug therapy: Secondary | ICD-10-CM | POA: Diagnosis not present

## 2017-10-25 DIAGNOSIS — Z23 Encounter for immunization: Secondary | ICD-10-CM | POA: Diagnosis not present

## 2017-10-25 DIAGNOSIS — I10 Essential (primary) hypertension: Secondary | ICD-10-CM | POA: Diagnosis not present

## 2017-10-25 DIAGNOSIS — Z1331 Encounter for screening for depression: Secondary | ICD-10-CM | POA: Diagnosis not present

## 2017-11-10 DIAGNOSIS — L821 Other seborrheic keratosis: Secondary | ICD-10-CM | POA: Diagnosis not present

## 2017-11-10 DIAGNOSIS — L820 Inflamed seborrheic keratosis: Secondary | ICD-10-CM | POA: Diagnosis not present

## 2017-11-10 DIAGNOSIS — L578 Other skin changes due to chronic exposure to nonionizing radiation: Secondary | ICD-10-CM | POA: Diagnosis not present

## 2017-11-16 DIAGNOSIS — Z1231 Encounter for screening mammogram for malignant neoplasm of breast: Secondary | ICD-10-CM | POA: Diagnosis not present

## 2017-12-09 ENCOUNTER — Other Ambulatory Visit: Payer: Self-pay

## 2017-12-28 DIAGNOSIS — H35039 Hypertensive retinopathy, unspecified eye: Secondary | ICD-10-CM | POA: Diagnosis not present

## 2017-12-28 DIAGNOSIS — H02839 Dermatochalasis of unspecified eye, unspecified eyelid: Secondary | ICD-10-CM | POA: Diagnosis not present

## 2018-02-18 DIAGNOSIS — J040 Acute laryngitis: Secondary | ICD-10-CM | POA: Diagnosis not present

## 2018-03-16 ENCOUNTER — Encounter: Payer: Self-pay | Admitting: Gastroenterology

## 2018-03-18 DIAGNOSIS — Z23 Encounter for immunization: Secondary | ICD-10-CM | POA: Diagnosis not present

## 2018-03-24 ENCOUNTER — Encounter: Payer: Self-pay | Admitting: Gastroenterology

## 2018-04-06 ENCOUNTER — Ambulatory Visit (AMBULATORY_SURGERY_CENTER): Payer: Self-pay | Admitting: *Deleted

## 2018-04-06 VITALS — Ht 62.0 in | Wt 147.0 lb

## 2018-04-06 DIAGNOSIS — Z86010 Personal history of colonic polyps: Secondary | ICD-10-CM

## 2018-04-06 MED ORDER — SOD PICOSULFATE-MAG OX-CIT ACD 10-3.5-12 MG-GM -GM/160ML PO SOLN: 1.0000 | ORAL | 0 refills | Status: DC

## 2018-04-06 NOTE — Progress Notes (Signed)
Patient denies any allergies to eggs or soy. Patient denies any problems with anesthesia/sedation. Patient denies any oxygen use at home. Patient denies taking any diet/weight loss medications or blood thinners. EMMI education assisgned to patient on colonoscopy, this was explained and instructions given to patient. Clenpiq free coupon given to pt.  She wants to do the [facility], she feels she will be unable to do the other preps offered. Pt aware this coupon may not work.

## 2018-04-18 ENCOUNTER — Encounter: Payer: Self-pay | Admitting: Gastroenterology

## 2018-04-18 ENCOUNTER — Ambulatory Visit (AMBULATORY_SURGERY_CENTER): Payer: Medicare Other | Admitting: Gastroenterology

## 2018-04-18 VITALS — BP 148/71 | HR 55 | Temp 97.5°F | Resp 11 | Ht 62.0 in | Wt 147.0 lb

## 2018-04-18 DIAGNOSIS — Z86010 Personal history of colonic polyps: Secondary | ICD-10-CM | POA: Diagnosis not present

## 2018-04-18 MED ORDER — SODIUM CHLORIDE 0.9 % IV SOLN: 500.0000 mL | Freq: Once | INTRAVENOUS | Status: DC

## 2018-04-18 NOTE — Progress Notes (Signed)
Pt's states no medical or surgical changes since previsit or office visit. 

## 2018-04-18 NOTE — Patient Instructions (Signed)
Discharge instructions given. Handouts on diverticulosis and hemorrhoids. Resume previous medications. YOU HAD AN ENDOSCOPIC PROCEDURE TODAY AT THE [facility]:   Refer to the procedure report that was given to you for any specific questions about what was found during the examination.  If the procedure report does not answer your questions, please call your gastroenterologist to clarify.  If you requested that your care partner not be given the details of your procedure findings, then the procedure report has been included in a sealed envelope for you to review at your convenience later.  YOU SHOULD EXPECT: Some feelings of bloating in the abdomen. Passage of more gas than usual.  Walking can help get rid of the air that was put into your GI tract during the procedure and reduce the bloating. If you had a lower endoscopy (such as a colonoscopy or flexible sigmoidoscopy) you may notice spotting of blood in your stool or on the toilet paper. If you underwent a bowel prep for your procedure, you may not have a normal bowel movement for a few days.  Please Note:  You might notice some irritation and congestion in your nose or some drainage.  This is from the oxygen used during your procedure.  There is no need for concern and it should clear up in a day or so.  SYMPTOMS TO REPORT IMMEDIATELY:   Following lower endoscopy (colonoscopy or flexible sigmoidoscopy):  Excessive amounts of blood in the stool  Significant tenderness or worsening of abdominal pains  Swelling of the abdomen that is new, acute  Fever of 100F or higher   For urgent or emergent issues, a gastroenterologist can be reached at any hour by calling [phone].   DIET:  We do recommend a small meal at first, but then you may proceed to your regular diet.  Drink plenty of fluids but you should avoid alcoholic beverages for 24 hours.  ACTIVITY:  You should plan to take it easy for the rest of today and you should  NOT DRIVE or use heavy machinery until tomorrow (because of the sedation medicines used during the test).    FOLLOW UP: Our staff will call the number listed on your records the next business day following your procedure to check on you and address any questions or concerns that you may have regarding the information given to you following your procedure. If we do not reach you, we will leave a message.  However, if you are feeling well and you are not experiencing any problems, there is no need to return our call.  We will assume that you have returned to your regular daily activities without incident.  If any biopsies were taken you will be contacted by phone or by letter within the next 1-3 weeks.  Please call us at [phone] if you have not heard about the biopsies in 3 weeks.    SIGNATURES/CONFIDENTIALITY: You and/or your care partner have signed paperwork which will be entered into your electronic medical record.  These signatures attest to the fact that that the information above on your After Visit Summary has been reviewed and is understood.  Full responsibility of the confidentiality of this discharge information lies with you and/or your care-partner. 

## 2018-04-18 NOTE — Progress Notes (Signed)
Report given to PACU, vss 

## 2018-04-18 NOTE — Op Note (Signed)
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 The patient's tolerance of                            previous anesthesia was also reviewed. The risks                            and benefits of the procedure and the sedation                            options and risks were discussed with the patient.                            All questions were answered, and informed consent                            was obtained. Prior Anticoagulants: The patient has                            taken no previous anticoagulant or antiplatelet                            agents. ASA Grade Assessment: II - A patient with                            mild systemic disease. After reviewing the risks                            and benefits, the patient was deemed in  satisfactory condition to undergo the procedure.                           After obtaining informed consent, the colonoscope                            was passed under direct vision. Throughout the                            procedure, the patient's blood pressure, pulse, and                            oxygen saturations were monitored continuously. The                            adult scope was passed up to sigmoid colon and                  could not be passed beyond. We switched to                            pediatric colonoscopy was passed with ease to the                            the ileocolonic anastomosis. The Colonoscope was                            introduced through the and advanced to the. The                            colonoscopy was performed without difficulty. The                            patient tolerated the procedure well. The quality                            of the bowel preparation was good. Scope In: 9:38:01 AM Scope Out: 9:55:05 AM Scope Withdrawal Time: 0 hours 6 minutes 12 seconds  Total Procedure Duration: 0 hours 17 minutes 4 seconds  Findings:                 A few small-mouthed diverticula were found in the                            sigmoid colon and ascending colon.                           Non-bleeding internal hemorrhoids were found during                            retroflexion.                           Otherwise normal colonoscopy to anastomosis. Complications:            No immediate complications. Estimated Blood Loss:  Estimated blood loss: none. Impression:               - Mild pancolonic diverticulosis.                           - Non-bleeding internal hemorrhoids.                           - Otherwise normal colonoscopy to anastomosis. No                            recurrence. Recommendation:           - Patient has a contact number available for                            emergencies. The signs and symptoms of potential                            delayed complications were discussed with the                            patient. Return to normal activities tomorrow.                            Written discharge instructions were provided to the                            patient.                           - Resume previous diet.                           - Continue present medications.                           - Repeat colonoscopy in 5 years for  surveillance.                           - Return to GI clinic PRN. Jackquline Denmark, MD 04/18/2018 10:05:54 AM This report has been signed electronically.

## 2018-04-19 ENCOUNTER — Telehealth: Payer: Self-pay

## 2018-04-19 NOTE — Telephone Encounter (Signed)
  Follow up Call-  Call back number 04/18/2018  Post procedure Call Back phone  # [phone]  Permission to leave phone message Yes  Some recent data might be hidden     Patient questions:  Do you have a fever, pain , or abdominal swelling? No. Pain Score  0 *  Have you tolerated food without any problems? Yes.    Have you been able to return to your normal activities? Yes.    Do you have any questions about your discharge instructions: Diet   No. Medications  No. Follow up visit  No.  Do you have questions or concerns about your Care? No.  Actions: * If pain score is 4 or above: No action needed, pain <4.

## 2018-04-25 DIAGNOSIS — L820 Inflamed seborrheic keratosis: Secondary | ICD-10-CM | POA: Diagnosis not present

## 2018-04-25 DIAGNOSIS — C44529 Squamous cell carcinoma of skin of other part of trunk: Secondary | ICD-10-CM | POA: Diagnosis not present

## 2018-05-12 DIAGNOSIS — J069 Acute upper respiratory infection, unspecified: Secondary | ICD-10-CM | POA: Diagnosis not present

## 2018-06-03 DIAGNOSIS — Z6826 Body mass index (BMI) 26.0-26.9, adult: Secondary | ICD-10-CM | POA: Diagnosis not present

## 2018-06-03 DIAGNOSIS — J40 Bronchitis, not specified as acute or chronic: Secondary | ICD-10-CM | POA: Diagnosis not present

## 2018-06-03 DIAGNOSIS — R05 Cough: Secondary | ICD-10-CM | POA: Diagnosis not present

## 2018-06-03 DIAGNOSIS — E039 Hypothyroidism, unspecified: Secondary | ICD-10-CM | POA: Diagnosis not present

## 2018-06-03 DIAGNOSIS — J019 Acute sinusitis, unspecified: Secondary | ICD-10-CM | POA: Diagnosis not present

## 2018-06-03 DIAGNOSIS — R739 Hyperglycemia, unspecified: Secondary | ICD-10-CM | POA: Diagnosis not present

## 2018-06-03 DIAGNOSIS — Z79899 Other long term (current) drug therapy: Secondary | ICD-10-CM | POA: Diagnosis not present

## 2018-06-03 DIAGNOSIS — E785 Hyperlipidemia, unspecified: Secondary | ICD-10-CM | POA: Diagnosis not present

## 2018-06-06 DIAGNOSIS — J40 Bronchitis, not specified as acute or chronic: Secondary | ICD-10-CM | POA: Diagnosis not present

## 2018-06-06 DIAGNOSIS — R05 Cough: Secondary | ICD-10-CM | POA: Diagnosis not present

## 2018-06-06 DIAGNOSIS — J9811 Atelectasis: Secondary | ICD-10-CM | POA: Diagnosis not present

## 2018-06-13 DIAGNOSIS — Z6826 Body mass index (BMI) 26.0-26.9, adult: Secondary | ICD-10-CM | POA: Diagnosis not present

## 2018-06-13 DIAGNOSIS — R05 Cough: Secondary | ICD-10-CM | POA: Diagnosis not present

## 2018-06-13 DIAGNOSIS — D72829 Elevated white blood cell count, unspecified: Secondary | ICD-10-CM | POA: Diagnosis not present

## 2018-06-13 DIAGNOSIS — E663 Overweight: Secondary | ICD-10-CM | POA: Diagnosis not present

## 2018-10-31 DIAGNOSIS — Z7189 Other specified counseling: Secondary | ICD-10-CM | POA: Diagnosis not present

## 2018-10-31 DIAGNOSIS — Z1231 Encounter for screening mammogram for malignant neoplasm of breast: Secondary | ICD-10-CM | POA: Diagnosis not present

## 2018-10-31 DIAGNOSIS — Z1331 Encounter for screening for depression: Secondary | ICD-10-CM | POA: Diagnosis not present

## 2018-10-31 DIAGNOSIS — Z6827 Body mass index (BMI) 27.0-27.9, adult: Secondary | ICD-10-CM | POA: Diagnosis not present

## 2018-10-31 DIAGNOSIS — Z0001 Encounter for general adult medical examination with abnormal findings: Secondary | ICD-10-CM | POA: Diagnosis not present

## 2018-10-31 DIAGNOSIS — E785 Hyperlipidemia, unspecified: Secondary | ICD-10-CM | POA: Diagnosis not present

## 2018-10-31 DIAGNOSIS — E039 Hypothyroidism, unspecified: Secondary | ICD-10-CM | POA: Diagnosis not present

## 2018-10-31 DIAGNOSIS — Z1339 Encounter for screening examination for other mental health and behavioral disorders: Secondary | ICD-10-CM | POA: Diagnosis not present

## 2018-10-31 DIAGNOSIS — I10 Essential (primary) hypertension: Secondary | ICD-10-CM | POA: Diagnosis not present

## 2018-10-31 DIAGNOSIS — Z79899 Other long term (current) drug therapy: Secondary | ICD-10-CM | POA: Diagnosis not present

## 2018-11-14 DIAGNOSIS — L821 Other seborrheic keratosis: Secondary | ICD-10-CM | POA: Diagnosis not present

## 2018-11-14 DIAGNOSIS — L578 Other skin changes due to chronic exposure to nonionizing radiation: Secondary | ICD-10-CM | POA: Diagnosis not present

## 2018-11-14 DIAGNOSIS — L820 Inflamed seborrheic keratosis: Secondary | ICD-10-CM | POA: Diagnosis not present

## 2018-11-28 DIAGNOSIS — Z6827 Body mass index (BMI) 27.0-27.9, adult: Secondary | ICD-10-CM | POA: Diagnosis not present

## 2018-11-28 DIAGNOSIS — I10 Essential (primary) hypertension: Secondary | ICD-10-CM | POA: Diagnosis not present

## 2018-12-19 DIAGNOSIS — I10 Essential (primary) hypertension: Secondary | ICD-10-CM | POA: Diagnosis not present

## 2018-12-19 DIAGNOSIS — L255 Unspecified contact dermatitis due to plants, except food: Secondary | ICD-10-CM | POA: Diagnosis not present

## 2018-12-19 DIAGNOSIS — J069 Acute upper respiratory infection, unspecified: Secondary | ICD-10-CM | POA: Diagnosis not present

## 2018-12-22 DIAGNOSIS — B028 Zoster with other complications: Secondary | ICD-10-CM | POA: Diagnosis not present

## 2018-12-22 DIAGNOSIS — Z6827 Body mass index (BMI) 27.0-27.9, adult: Secondary | ICD-10-CM | POA: Diagnosis not present

## 2018-12-23 DIAGNOSIS — B0232 Zoster iridocyclitis: Secondary | ICD-10-CM | POA: Diagnosis not present

## 2018-12-27 DIAGNOSIS — B0232 Zoster iridocyclitis: Secondary | ICD-10-CM | POA: Diagnosis not present

## 2018-12-30 DIAGNOSIS — R51 Headache: Secondary | ICD-10-CM | POA: Diagnosis not present

## 2018-12-30 DIAGNOSIS — Z6827 Body mass index (BMI) 27.0-27.9, adult: Secondary | ICD-10-CM | POA: Diagnosis not present

## 2018-12-30 DIAGNOSIS — B028 Zoster with other complications: Secondary | ICD-10-CM | POA: Diagnosis not present

## 2018-12-30 DIAGNOSIS — I10 Essential (primary) hypertension: Secondary | ICD-10-CM | POA: Diagnosis not present

## 2019-01-06 DIAGNOSIS — Z1231 Encounter for screening mammogram for malignant neoplasm of breast: Secondary | ICD-10-CM | POA: Diagnosis not present

## 2019-01-10 DIAGNOSIS — B0232 Zoster iridocyclitis: Secondary | ICD-10-CM | POA: Diagnosis not present

## 2019-01-24 DIAGNOSIS — Z23 Encounter for immunization: Secondary | ICD-10-CM | POA: Diagnosis not present

## 2019-01-24 DIAGNOSIS — Z1331 Encounter for screening for depression: Secondary | ICD-10-CM | POA: Diagnosis not present

## 2019-01-24 DIAGNOSIS — Z6826 Body mass index (BMI) 26.0-26.9, adult: Secondary | ICD-10-CM | POA: Diagnosis not present

## 2019-01-24 DIAGNOSIS — I10 Essential (primary) hypertension: Secondary | ICD-10-CM | POA: Diagnosis not present

## 2019-03-02 DIAGNOSIS — B0233 Zoster keratitis: Secondary | ICD-10-CM | POA: Diagnosis not present

## 2019-03-02 DIAGNOSIS — B0232 Zoster iridocyclitis: Secondary | ICD-10-CM | POA: Diagnosis not present

## 2019-03-03 DIAGNOSIS — B0233 Zoster keratitis: Secondary | ICD-10-CM | POA: Diagnosis not present

## 2019-03-03 DIAGNOSIS — B0232 Zoster iridocyclitis: Secondary | ICD-10-CM | POA: Diagnosis not present

## 2019-03-07 DIAGNOSIS — B0232 Zoster iridocyclitis: Secondary | ICD-10-CM | POA: Diagnosis not present

## 2019-03-07 DIAGNOSIS — B0233 Zoster keratitis: Secondary | ICD-10-CM | POA: Diagnosis not present

## 2019-03-10 DIAGNOSIS — B0232 Zoster iridocyclitis: Secondary | ICD-10-CM | POA: Diagnosis not present

## 2019-03-10 DIAGNOSIS — B0233 Zoster keratitis: Secondary | ICD-10-CM | POA: Diagnosis not present

## 2019-03-14 DIAGNOSIS — B0232 Zoster iridocyclitis: Secondary | ICD-10-CM | POA: Diagnosis not present

## 2019-03-14 DIAGNOSIS — B0233 Zoster keratitis: Secondary | ICD-10-CM | POA: Diagnosis not present

## 2019-03-24 DIAGNOSIS — B0232 Zoster iridocyclitis: Secondary | ICD-10-CM | POA: Diagnosis not present

## 2019-03-24 DIAGNOSIS — B0233 Zoster keratitis: Secondary | ICD-10-CM | POA: Diagnosis not present

## 2019-04-05 ENCOUNTER — Other Ambulatory Visit: Payer: Self-pay

## 2019-04-12 DIAGNOSIS — M79642 Pain in left hand: Secondary | ICD-10-CM | POA: Diagnosis not present

## 2019-04-12 DIAGNOSIS — M25532 Pain in left wrist: Secondary | ICD-10-CM | POA: Diagnosis not present

## 2019-04-12 DIAGNOSIS — M79641 Pain in right hand: Secondary | ICD-10-CM | POA: Diagnosis not present

## 2019-04-12 DIAGNOSIS — B0232 Zoster iridocyclitis: Secondary | ICD-10-CM | POA: Diagnosis not present

## 2019-04-12 DIAGNOSIS — B0233 Zoster keratitis: Secondary | ICD-10-CM | POA: Diagnosis not present

## 2019-04-14 DIAGNOSIS — H20042 Secondary noninfectious iridocyclitis, left eye: Secondary | ICD-10-CM | POA: Diagnosis not present

## 2019-04-14 DIAGNOSIS — H43813 Vitreous degeneration, bilateral: Secondary | ICD-10-CM | POA: Diagnosis not present

## 2019-04-14 DIAGNOSIS — H43392 Other vitreous opacities, left eye: Secondary | ICD-10-CM | POA: Diagnosis not present

## 2019-05-16 DIAGNOSIS — H43813 Vitreous degeneration, bilateral: Secondary | ICD-10-CM | POA: Diagnosis not present

## 2019-05-16 DIAGNOSIS — H20042 Secondary noninfectious iridocyclitis, left eye: Secondary | ICD-10-CM | POA: Diagnosis not present

## 2019-05-16 DIAGNOSIS — H40042 Steroid responder, left eye: Secondary | ICD-10-CM | POA: Diagnosis not present

## 2019-05-16 DIAGNOSIS — H43392 Other vitreous opacities, left eye: Secondary | ICD-10-CM | POA: Diagnosis not present

## 2019-05-25 DIAGNOSIS — Z6826 Body mass index (BMI) 26.0-26.9, adult: Secondary | ICD-10-CM | POA: Diagnosis not present

## 2019-05-25 DIAGNOSIS — E1165 Type 2 diabetes mellitus with hyperglycemia: Secondary | ICD-10-CM | POA: Diagnosis not present

## 2019-06-27 DIAGNOSIS — H44119 Panuveitis, unspecified eye: Secondary | ICD-10-CM | POA: Diagnosis not present

## 2019-06-27 DIAGNOSIS — H20042 Secondary noninfectious iridocyclitis, left eye: Secondary | ICD-10-CM | POA: Diagnosis not present

## 2019-06-27 DIAGNOSIS — H43813 Vitreous degeneration, bilateral: Secondary | ICD-10-CM | POA: Diagnosis not present

## 2019-07-04 DIAGNOSIS — H43813 Vitreous degeneration, bilateral: Secondary | ICD-10-CM | POA: Diagnosis not present

## 2019-07-04 DIAGNOSIS — H20042 Secondary noninfectious iridocyclitis, left eye: Secondary | ICD-10-CM | POA: Diagnosis not present

## 2019-07-04 DIAGNOSIS — H43392 Other vitreous opacities, left eye: Secondary | ICD-10-CM | POA: Diagnosis not present

## 2019-07-18 DIAGNOSIS — H20042 Secondary noninfectious iridocyclitis, left eye: Secondary | ICD-10-CM | POA: Diagnosis not present

## 2019-07-18 DIAGNOSIS — H43813 Vitreous degeneration, bilateral: Secondary | ICD-10-CM | POA: Diagnosis not present

## 2019-07-18 DIAGNOSIS — H40042 Steroid responder, left eye: Secondary | ICD-10-CM | POA: Diagnosis not present

## 2019-07-18 DIAGNOSIS — H43392 Other vitreous opacities, left eye: Secondary | ICD-10-CM | POA: Diagnosis not present

## 2019-08-15 DIAGNOSIS — H43813 Vitreous degeneration, bilateral: Secondary | ICD-10-CM | POA: Diagnosis not present

## 2019-08-15 DIAGNOSIS — H40042 Steroid responder, left eye: Secondary | ICD-10-CM | POA: Diagnosis not present

## 2019-08-15 DIAGNOSIS — H43392 Other vitreous opacities, left eye: Secondary | ICD-10-CM | POA: Diagnosis not present

## 2019-08-15 DIAGNOSIS — H20042 Secondary noninfectious iridocyclitis, left eye: Secondary | ICD-10-CM | POA: Diagnosis not present

## 2019-08-22 DIAGNOSIS — I10 Essential (primary) hypertension: Secondary | ICD-10-CM | POA: Diagnosis not present

## 2019-08-22 DIAGNOSIS — E1165 Type 2 diabetes mellitus with hyperglycemia: Secondary | ICD-10-CM | POA: Diagnosis not present

## 2019-08-22 DIAGNOSIS — Z79899 Other long term (current) drug therapy: Secondary | ICD-10-CM | POA: Diagnosis not present

## 2019-08-22 DIAGNOSIS — E785 Hyperlipidemia, unspecified: Secondary | ICD-10-CM | POA: Diagnosis not present

## 2019-08-29 DIAGNOSIS — E785 Hyperlipidemia, unspecified: Secondary | ICD-10-CM | POA: Diagnosis not present

## 2019-08-29 DIAGNOSIS — E1169 Type 2 diabetes mellitus with other specified complication: Secondary | ICD-10-CM | POA: Diagnosis not present

## 2019-08-29 DIAGNOSIS — Z6824 Body mass index (BMI) 24.0-24.9, adult: Secondary | ICD-10-CM | POA: Diagnosis not present

## 2019-08-29 DIAGNOSIS — E039 Hypothyroidism, unspecified: Secondary | ICD-10-CM | POA: Diagnosis not present

## 2019-08-29 DIAGNOSIS — Z1331 Encounter for screening for depression: Secondary | ICD-10-CM | POA: Diagnosis not present

## 2019-08-29 DIAGNOSIS — Z7189 Other specified counseling: Secondary | ICD-10-CM | POA: Diagnosis not present

## 2019-08-29 DIAGNOSIS — I10 Essential (primary) hypertension: Secondary | ICD-10-CM | POA: Diagnosis not present

## 2019-10-05 DIAGNOSIS — B0232 Zoster iridocyclitis: Secondary | ICD-10-CM | POA: Diagnosis not present

## 2019-10-05 DIAGNOSIS — B0233 Zoster keratitis: Secondary | ICD-10-CM | POA: Diagnosis not present

## 2019-10-11 DIAGNOSIS — M25552 Pain in left hip: Secondary | ICD-10-CM | POA: Diagnosis not present

## 2019-10-11 DIAGNOSIS — M545 Low back pain: Secondary | ICD-10-CM | POA: Diagnosis not present

## 2019-11-28 DIAGNOSIS — L570 Actinic keratosis: Secondary | ICD-10-CM | POA: Diagnosis not present

## 2019-11-28 DIAGNOSIS — L820 Inflamed seborrheic keratosis: Secondary | ICD-10-CM | POA: Diagnosis not present

## 2019-11-28 DIAGNOSIS — L821 Other seborrheic keratosis: Secondary | ICD-10-CM | POA: Diagnosis not present

## 2019-11-28 DIAGNOSIS — L578 Other skin changes due to chronic exposure to nonionizing radiation: Secondary | ICD-10-CM | POA: Diagnosis not present

## 2019-11-30 DIAGNOSIS — Z7189 Other specified counseling: Secondary | ICD-10-CM | POA: Diagnosis not present

## 2019-11-30 DIAGNOSIS — Z1331 Encounter for screening for depression: Secondary | ICD-10-CM | POA: Diagnosis not present

## 2019-11-30 DIAGNOSIS — Z1159 Encounter for screening for other viral diseases: Secondary | ICD-10-CM | POA: Diagnosis not present

## 2019-11-30 DIAGNOSIS — Z0000 Encounter for general adult medical examination without abnormal findings: Secondary | ICD-10-CM | POA: Diagnosis not present

## 2019-11-30 DIAGNOSIS — E039 Hypothyroidism, unspecified: Secondary | ICD-10-CM | POA: Diagnosis not present

## 2019-11-30 DIAGNOSIS — E785 Hyperlipidemia, unspecified: Secondary | ICD-10-CM | POA: Diagnosis not present

## 2019-11-30 DIAGNOSIS — Z1339 Encounter for screening examination for other mental health and behavioral disorders: Secondary | ICD-10-CM | POA: Diagnosis not present

## 2019-11-30 DIAGNOSIS — Z79899 Other long term (current) drug therapy: Secondary | ICD-10-CM | POA: Diagnosis not present

## 2019-11-30 DIAGNOSIS — I10 Essential (primary) hypertension: Secondary | ICD-10-CM | POA: Diagnosis not present

## 2019-11-30 DIAGNOSIS — Z6825 Body mass index (BMI) 25.0-25.9, adult: Secondary | ICD-10-CM | POA: Diagnosis not present

## 2019-11-30 DIAGNOSIS — R0981 Nasal congestion: Secondary | ICD-10-CM | POA: Diagnosis not present

## 2019-11-30 DIAGNOSIS — Z1231 Encounter for screening mammogram for malignant neoplasm of breast: Secondary | ICD-10-CM | POA: Diagnosis not present

## 2019-11-30 DIAGNOSIS — E1169 Type 2 diabetes mellitus with other specified complication: Secondary | ICD-10-CM | POA: Diagnosis not present

## 2019-12-21 DIAGNOSIS — B0232 Zoster iridocyclitis: Secondary | ICD-10-CM | POA: Diagnosis not present

## 2019-12-21 DIAGNOSIS — B0233 Zoster keratitis: Secondary | ICD-10-CM | POA: Diagnosis not present

## 2020-01-08 DIAGNOSIS — Z1231 Encounter for screening mammogram for malignant neoplasm of breast: Secondary | ICD-10-CM | POA: Diagnosis not present

## 2020-01-09 DIAGNOSIS — M545 Low back pain: Secondary | ICD-10-CM | POA: Diagnosis not present

## 2020-01-11 DIAGNOSIS — B0233 Zoster keratitis: Secondary | ICD-10-CM | POA: Diagnosis not present

## 2020-01-11 DIAGNOSIS — H5201 Hypermetropia, right eye: Secondary | ICD-10-CM | POA: Diagnosis not present

## 2020-01-11 DIAGNOSIS — B0232 Zoster iridocyclitis: Secondary | ICD-10-CM | POA: Diagnosis not present

## 2020-01-11 DIAGNOSIS — Z961 Presence of intraocular lens: Secondary | ICD-10-CM | POA: Diagnosis not present

## 2020-01-12 DIAGNOSIS — D72829 Elevated white blood cell count, unspecified: Secondary | ICD-10-CM | POA: Diagnosis not present

## 2020-01-18 DIAGNOSIS — N390 Urinary tract infection, site not specified: Secondary | ICD-10-CM | POA: Diagnosis not present

## 2020-01-23 DIAGNOSIS — M5416 Radiculopathy, lumbar region: Secondary | ICD-10-CM | POA: Diagnosis not present

## 2020-01-23 DIAGNOSIS — M545 Low back pain: Secondary | ICD-10-CM | POA: Diagnosis not present

## 2020-01-29 DIAGNOSIS — Z23 Encounter for immunization: Secondary | ICD-10-CM | POA: Diagnosis not present

## 2020-01-29 DIAGNOSIS — M545 Low back pain: Secondary | ICD-10-CM | POA: Diagnosis not present

## 2020-03-14 DIAGNOSIS — Z79899 Other long term (current) drug therapy: Secondary | ICD-10-CM | POA: Diagnosis not present

## 2020-03-14 DIAGNOSIS — E039 Hypothyroidism, unspecified: Secondary | ICD-10-CM | POA: Diagnosis not present

## 2020-03-14 DIAGNOSIS — E1169 Type 2 diabetes mellitus with other specified complication: Secondary | ICD-10-CM | POA: Diagnosis not present

## 2020-04-15 DIAGNOSIS — R7989 Other specified abnormal findings of blood chemistry: Secondary | ICD-10-CM | POA: Diagnosis not present

## 2020-06-17 DIAGNOSIS — M5459 Other low back pain: Secondary | ICD-10-CM | POA: Diagnosis not present

## 2020-06-24 DIAGNOSIS — I10 Essential (primary) hypertension: Secondary | ICD-10-CM | POA: Diagnosis not present

## 2020-06-24 DIAGNOSIS — B379 Candidiasis, unspecified: Secondary | ICD-10-CM | POA: Diagnosis not present

## 2020-06-24 DIAGNOSIS — Z79899 Other long term (current) drug therapy: Secondary | ICD-10-CM | POA: Diagnosis not present

## 2020-06-24 DIAGNOSIS — E785 Hyperlipidemia, unspecified: Secondary | ICD-10-CM | POA: Diagnosis not present

## 2020-06-24 DIAGNOSIS — Z1331 Encounter for screening for depression: Secondary | ICD-10-CM | POA: Diagnosis not present

## 2020-06-24 DIAGNOSIS — E1165 Type 2 diabetes mellitus with hyperglycemia: Secondary | ICD-10-CM | POA: Diagnosis not present

## 2020-07-16 DIAGNOSIS — Z961 Presence of intraocular lens: Secondary | ICD-10-CM | POA: Diagnosis not present

## 2020-07-16 DIAGNOSIS — B0233 Zoster keratitis: Secondary | ICD-10-CM | POA: Diagnosis not present

## 2020-07-16 DIAGNOSIS — H43393 Other vitreous opacities, bilateral: Secondary | ICD-10-CM | POA: Diagnosis not present

## 2020-07-16 DIAGNOSIS — B0232 Zoster iridocyclitis: Secondary | ICD-10-CM | POA: Diagnosis not present

## 2020-07-29 DIAGNOSIS — E1165 Type 2 diabetes mellitus with hyperglycemia: Secondary | ICD-10-CM | POA: Diagnosis not present

## 2020-08-09 DIAGNOSIS — Z6824 Body mass index (BMI) 24.0-24.9, adult: Secondary | ICD-10-CM | POA: Diagnosis not present

## 2020-08-09 DIAGNOSIS — I10 Essential (primary) hypertension: Secondary | ICD-10-CM | POA: Diagnosis not present

## 2020-08-09 DIAGNOSIS — E785 Hyperlipidemia, unspecified: Secondary | ICD-10-CM | POA: Diagnosis not present

## 2020-08-09 DIAGNOSIS — E1169 Type 2 diabetes mellitus with other specified complication: Secondary | ICD-10-CM | POA: Diagnosis not present

## 2020-08-09 DIAGNOSIS — M542 Cervicalgia: Secondary | ICD-10-CM | POA: Diagnosis not present

## 2020-08-09 DIAGNOSIS — E039 Hypothyroidism, unspecified: Secondary | ICD-10-CM | POA: Diagnosis not present

## 2020-08-27 DIAGNOSIS — Z961 Presence of intraocular lens: Secondary | ICD-10-CM | POA: Diagnosis not present

## 2020-08-27 DIAGNOSIS — B0232 Zoster iridocyclitis: Secondary | ICD-10-CM | POA: Diagnosis not present

## 2020-08-27 DIAGNOSIS — B0233 Zoster keratitis: Secondary | ICD-10-CM | POA: Diagnosis not present

## 2020-08-27 DIAGNOSIS — H43393 Other vitreous opacities, bilateral: Secondary | ICD-10-CM | POA: Diagnosis not present

## 2020-09-07 DIAGNOSIS — I10 Essential (primary) hypertension: Secondary | ICD-10-CM | POA: Diagnosis not present

## 2020-09-07 DIAGNOSIS — E785 Hyperlipidemia, unspecified: Secondary | ICD-10-CM | POA: Diagnosis not present

## 2020-09-07 DIAGNOSIS — E1165 Type 2 diabetes mellitus with hyperglycemia: Secondary | ICD-10-CM | POA: Diagnosis not present

## 2020-09-07 DIAGNOSIS — E039 Hypothyroidism, unspecified: Secondary | ICD-10-CM | POA: Diagnosis not present

## 2020-09-14 DIAGNOSIS — J309 Allergic rhinitis, unspecified: Secondary | ICD-10-CM | POA: Diagnosis not present

## 2020-11-07 DIAGNOSIS — E1165 Type 2 diabetes mellitus with hyperglycemia: Secondary | ICD-10-CM | POA: Diagnosis not present

## 2020-11-07 DIAGNOSIS — E039 Hypothyroidism, unspecified: Secondary | ICD-10-CM | POA: Diagnosis not present

## 2020-11-07 DIAGNOSIS — I10 Essential (primary) hypertension: Secondary | ICD-10-CM | POA: Diagnosis not present

## 2020-11-07 DIAGNOSIS — E785 Hyperlipidemia, unspecified: Secondary | ICD-10-CM | POA: Diagnosis not present

## 2020-11-28 DIAGNOSIS — L821 Other seborrheic keratosis: Secondary | ICD-10-CM | POA: Diagnosis not present

## 2020-11-28 DIAGNOSIS — L570 Actinic keratosis: Secondary | ICD-10-CM | POA: Diagnosis not present

## 2020-11-28 DIAGNOSIS — L820 Inflamed seborrheic keratosis: Secondary | ICD-10-CM | POA: Diagnosis not present

## 2020-11-28 DIAGNOSIS — L578 Other skin changes due to chronic exposure to nonionizing radiation: Secondary | ICD-10-CM | POA: Diagnosis not present

## 2020-12-10 DIAGNOSIS — Z79899 Other long term (current) drug therapy: Secondary | ICD-10-CM | POA: Diagnosis not present

## 2020-12-10 DIAGNOSIS — E1169 Type 2 diabetes mellitus with other specified complication: Secondary | ICD-10-CM | POA: Diagnosis not present

## 2020-12-10 DIAGNOSIS — I10 Essential (primary) hypertension: Secondary | ICD-10-CM | POA: Diagnosis not present

## 2020-12-10 DIAGNOSIS — E785 Hyperlipidemia, unspecified: Secondary | ICD-10-CM | POA: Diagnosis not present

## 2020-12-10 DIAGNOSIS — Z7189 Other specified counseling: Secondary | ICD-10-CM | POA: Diagnosis not present

## 2020-12-10 DIAGNOSIS — Z1331 Encounter for screening for depression: Secondary | ICD-10-CM | POA: Diagnosis not present

## 2020-12-10 DIAGNOSIS — Z6824 Body mass index (BMI) 24.0-24.9, adult: Secondary | ICD-10-CM | POA: Diagnosis not present

## 2020-12-10 DIAGNOSIS — Z1231 Encounter for screening mammogram for malignant neoplasm of breast: Secondary | ICD-10-CM | POA: Diagnosis not present

## 2020-12-10 DIAGNOSIS — E039 Hypothyroidism, unspecified: Secondary | ICD-10-CM | POA: Diagnosis not present

## 2020-12-10 DIAGNOSIS — Z0000 Encounter for general adult medical examination without abnormal findings: Secondary | ICD-10-CM | POA: Diagnosis not present

## 2021-01-14 DIAGNOSIS — Z1231 Encounter for screening mammogram for malignant neoplasm of breast: Secondary | ICD-10-CM | POA: Diagnosis not present

## 2021-02-07 DIAGNOSIS — E1165 Type 2 diabetes mellitus with hyperglycemia: Secondary | ICD-10-CM | POA: Diagnosis not present

## 2021-02-07 DIAGNOSIS — I10 Essential (primary) hypertension: Secondary | ICD-10-CM | POA: Diagnosis not present

## 2021-02-07 DIAGNOSIS — E785 Hyperlipidemia, unspecified: Secondary | ICD-10-CM | POA: Diagnosis not present

## 2021-03-03 DIAGNOSIS — Z23 Encounter for immunization: Secondary | ICD-10-CM | POA: Diagnosis not present

## 2021-03-10 DIAGNOSIS — E1165 Type 2 diabetes mellitus with hyperglycemia: Secondary | ICD-10-CM | POA: Diagnosis not present

## 2021-03-10 DIAGNOSIS — E785 Hyperlipidemia, unspecified: Secondary | ICD-10-CM | POA: Diagnosis not present

## 2021-03-10 DIAGNOSIS — I10 Essential (primary) hypertension: Secondary | ICD-10-CM | POA: Diagnosis not present

## 2021-05-09 DIAGNOSIS — E1165 Type 2 diabetes mellitus with hyperglycemia: Secondary | ICD-10-CM | POA: Diagnosis not present

## 2021-05-09 DIAGNOSIS — E785 Hyperlipidemia, unspecified: Secondary | ICD-10-CM | POA: Diagnosis not present

## 2021-05-09 DIAGNOSIS — I10 Essential (primary) hypertension: Secondary | ICD-10-CM | POA: Diagnosis not present

## 2021-05-27 DIAGNOSIS — Z79899 Other long term (current) drug therapy: Secondary | ICD-10-CM | POA: Diagnosis not present

## 2021-05-27 DIAGNOSIS — Z6824 Body mass index (BMI) 24.0-24.9, adult: Secondary | ICD-10-CM | POA: Diagnosis not present

## 2021-05-27 DIAGNOSIS — E785 Hyperlipidemia, unspecified: Secondary | ICD-10-CM | POA: Diagnosis not present

## 2021-05-27 DIAGNOSIS — L659 Nonscarring hair loss, unspecified: Secondary | ICD-10-CM | POA: Diagnosis not present

## 2021-05-27 DIAGNOSIS — I10 Essential (primary) hypertension: Secondary | ICD-10-CM | POA: Diagnosis not present

## 2021-05-27 DIAGNOSIS — E039 Hypothyroidism, unspecified: Secondary | ICD-10-CM | POA: Diagnosis not present

## 2021-05-27 DIAGNOSIS — E1169 Type 2 diabetes mellitus with other specified complication: Secondary | ICD-10-CM | POA: Diagnosis not present

## 2021-06-19 DIAGNOSIS — L659 Nonscarring hair loss, unspecified: Secondary | ICD-10-CM | POA: Diagnosis not present

## 2021-09-24 DIAGNOSIS — Z6824 Body mass index (BMI) 24.0-24.9, adult: Secondary | ICD-10-CM | POA: Diagnosis not present

## 2021-09-24 DIAGNOSIS — M79673 Pain in unspecified foot: Secondary | ICD-10-CM | POA: Diagnosis not present

## 2021-09-24 DIAGNOSIS — Z79899 Other long term (current) drug therapy: Secondary | ICD-10-CM | POA: Diagnosis not present

## 2021-09-24 DIAGNOSIS — E1169 Type 2 diabetes mellitus with other specified complication: Secondary | ICD-10-CM | POA: Diagnosis not present

## 2021-09-24 DIAGNOSIS — E039 Hypothyroidism, unspecified: Secondary | ICD-10-CM | POA: Diagnosis not present

## 2021-09-24 DIAGNOSIS — I10 Essential (primary) hypertension: Secondary | ICD-10-CM | POA: Diagnosis not present

## 2021-09-24 DIAGNOSIS — E785 Hyperlipidemia, unspecified: Secondary | ICD-10-CM | POA: Diagnosis not present

## 2021-09-29 DIAGNOSIS — M79671 Pain in right foot: Secondary | ICD-10-CM | POA: Diagnosis not present

## 2021-09-29 DIAGNOSIS — M79672 Pain in left foot: Secondary | ICD-10-CM | POA: Diagnosis not present

## 2021-10-15 ENCOUNTER — Encounter: Payer: Self-pay | Admitting: Podiatrist

## 2021-10-15 ENCOUNTER — Ambulatory Visit (INDEPENDENT_AMBULATORY_CARE_PROVIDER_SITE_OTHER): Payer: Medicare Other | Admitting: Podiatrist

## 2021-10-15 ENCOUNTER — Ambulatory Visit (INDEPENDENT_AMBULATORY_CARE_PROVIDER_SITE_OTHER): Payer: Medicare Other

## 2021-10-15 DIAGNOSIS — M778 Other enthesopathies, not elsewhere classified: Secondary | ICD-10-CM

## 2021-10-15 DIAGNOSIS — M779 Enthesopathy, unspecified: Secondary | ICD-10-CM

## 2021-10-15 DIAGNOSIS — M7752 Other enthesopathy of left foot: Secondary | ICD-10-CM

## 2021-10-15 DIAGNOSIS — M79672 Pain in left foot: Secondary | ICD-10-CM | POA: Diagnosis not present

## 2021-10-15 MED ORDER — TRIAMCINOLONE ACETONIDE 10 MG/ML IJ SUSP
10.0000 mg | Freq: Once | INTRAMUSCULAR | Status: AC
  Administered 2021-10-15: 10 mg

## 2021-10-15 NOTE — Patient Instructions (Signed)
Joint Steroid Injection A joint steroid injection is a procedure to relieve swelling and pain in a joint. Steroids are medicines that reduce inflammation. In this procedure, your health care provider uses a syringe and a needle to inject a steroid medicine into a painful and inflamed joint. A pain-relieving medicine (anesthetic) may be injected along with the steroid.  Joints that are often treated with steroid injections include the ankle, and joints of the hands or feet. You may have joint steroid injections as part of your treatment for inflammation caused by: Gout. Rheumatoid arthritis. Advanced wear-and-tear arthritis (osteoarthritis). Tendinitis. Bursitis.  Tell a health care provider about: Any allergies you have. All medicines you are taking, including vitamins, herbs, eye drops, creams, and over-the-counter medicines. Any problems you or family members have had with anesthetic medicines. Any blood disorders you have. Any surgeries you have had. Any medical conditions you have. Whether you are pregnant or may be pregnant. What are the risks? Generally, this is a safe treatment. However, problems may occur, including: Infection. Bleeding. Allergic reactions to medicines. Damage to the joint or tissues around the joint. Thinning of skin or loss of skin color over the joint. Temporary flushing of the face or chest. Temporary increase in pain. Temporary increase in blood sugar. Failure to relieve inflammation or pain. What happens before the treatment? Medicines Ask your health care provider about: Changing or stopping your regular medicines. This is especially important if you are taking diabetes medicines or blood thinners. Taking medicines such as aspirin and ibuprofen. These medicines can thin your blood. Do not take these medicines unless your health care provider tells you to take them. Taking over-the-counter medicines, vitamins, herbs, and supplements.  What can I  expect after the treatment? You will be able to go home after the treatment. It is normal to feel slight flushing for a few days after the injection. After the treatment, it is common to have an increase in joint pain after the anesthetic has worn off. This may happen about an hour after a short-acting anesthetic or about 8 hours after a longer-acting anesthetic. You should begin to feel relief from joint pain and swelling after 24 to 48 hours. Contact your health care provider if you do not begin to feel relief after 2 days. Follow these instructions at home: Injection site care Leave the adhesive dressing over your injection site in place until your health care provider says you can remove it. Check your injection site every day for signs of infection. Check for: More redness, swelling, or pain. Fluid or blood. Warmth. Pus or a bad smell. Activity Return to your normal activities as told by your health care provider. Ask your health care provider what activities are safe for you. You may be asked to limit activities that put stress on the joint for a few days.  Managing pain, stiffness, and swelling  If directed, put ice on the joint. To do this: Put ice in a plastic bag. Place a towel between your skin and the bag. Leave the ice on for 20 minutes, 2-3 times a day. Remove the ice if your skin turns bright red. This is very important. If you cannot feel pain, heat, or cold, you have a greater risk of damage to the area. Raise (elevate) your joint above the level of your heart when you are sitting or lying down. General instructions Take over-the-counter and prescription medicines only as told by your health care provider. Do not use any products that contain  nicotine or tobacco, such as cigarettes, e-cigarettes, and chewing tobacco. These can delay joint healing. If you need help quitting, ask your health care provider. If you have diabetes, be aware that your blood sugar may be slightly  elevated for several days after the injection. Keep all follow-up visits. This is important. Contact a health care provider if you have: Chills or a fever. Any signs of infection at your injection site. Increased pain or swelling or no relief after 2 days. Summary A joint steroid injection is a treatment to relieve pain and swelling in a joint. Steroids are medicines that reduce inflammation. Your health care provider may add an anesthetic along with the steroid. You may have joint steroid injections as part of your arthritis treatment. Joint steroid injections may be repeated, but having them too often can damage a joint or the skin over the joint. Contact your health care provider if you have a fever, chills, or signs of infection, or if you get no relief from joint pain or swelling. This information is not intended to replace advice given to you by your health care provider. Make sure you discuss any questions you have with your health care provider. Document Revised: 10/06/2019 Document Reviewed: 10/06/2019 Elsevier Patient Education  [place].

## 2021-10-15 NOTE — Progress Notes (Signed)
No chief complaint on file.    HPI: Patient is 81 y.o. female who presents today for pain dorsal aspect of the left foot.  She has had pain for about a month and denies any recent injury.  In the past she dropped a piece of wood on the left second toe but doesn't recall if the wood hit that part of her foot on the top.  She relates that shoes are painful when they hit this area.  Patient Active Problem List   Diagnosis Date Noted   Postoperative examination 10/16/2015   Right upper quadrant abdominal pain 08/21/2015   Abnormal electrocardiogram (ECG) (EKG) 10/23/2014   Anticoagulated 10/19/2014   Contusion of abdominal wall 10/19/2014   Encounter for preprocedural cardiovascular examination 10/19/2014   Incisional hernia without obstruction or gangrene 10/19/2014    Current Outpatient Medications on File Prior to Visit  Medication Sig Dispense Refill   ACCU-CHEK GUIDE test strip USE 1 STRIP ONCE DAILY     acyclovir (ZOVIRAX) 800 MG tablet acyclovir 800 mg tablet  TAKE 1 TABLET BY MOUTH FIVE TIMES DAILY     amLODipine (NORVASC) 5 MG tablet amlodipine 5 mg tablet  TAKE 1 TABLET BY MOUTH ONCE DAILY FOR HIGH BLOOD PRESSURE     atorvastatin (LIPITOR) 10 MG tablet Take 10 mg by mouth daily.     celecoxib (CELEBREX) 200 MG capsule celecoxib 200 mg capsule  TAKE 1 CAPSULE BY MOUTH ONCE DAILY     cholecalciferol (VITAMIN D) 25 MCG (1000 UNIT) tablet      docusate sodium (COLACE) 100 MG capsule Take 200 mg by mouth daily.     levothyroxine (SYNTHROID, LEVOTHROID) 50 MCG tablet Take 1 tablet by mouth daily.     montelukast (SINGULAIR) 10 MG tablet Take 1 tablet by mouth daily.     pioglitazone (ACTOS) 15 MG tablet pioglitazone 15 mg tablet  TAKE 1 TABLET BY MOUTH ONCE DAILY FOR BLOOD SUGAR     prednisoLONE acetate (PRED FORTE) 1 % ophthalmic suspension prednisolone acetate 1 % eye drops,suspension  INSTILL 1 DROP INTO LEFT EYE EVERY 3 HOURS WHILE AWAKE     predniSONE (DELTASONE) 10 MG tablet       sulfamethoxazole-trimethoprim (BACTRIM DS) 800-160 MG tablet sulfamethoxazole 800 mg-trimethoprim 160 mg tablet  TAKE 1 TABLET BY MOUTH TWICE DAILY FOR 5 DAYS     tiZANidine (ZANAFLEX) 2 MG tablet      triamcinolone (NASACORT) 55 MCG/ACT AERO nasal inhaler Nasal Allergy 55 mcg spray aerosol  Spray 2 sprays every day by intranasal route.     No current facility-administered medications on file prior to visit.    Allergies  Allergen Reactions   Codeine Nausea And Vomiting   Penicillins Swelling and Other (See Comments)    Unknown     Review of Systems No fevers, chills, nausea, muscle aches, no difficulty breathing, no calf pain, no chest pain or shortness of breath.   Physical Exam  GENERAL APPEARANCE: Alert, conversant. Appropriately groomed. No acute distress.   VASCULAR: Pedal pulses palpable DP and PT bilateral.  Capillary refill time is immediate to all digits,  Proximal to distal cooling it warm to warm.  Digital perfusion adequate.   NEUROLOGIC: sensation is intact to 5.07 monofilament at 5/5 sites bilateral.  Light touch is intact bilateral, vibratory sensation intact bilateral  MUSCULOSKELETAL: acceptable muscle strength, tone and stability bilateral.  Pain on palpation dorsal aspect of the left foot at the first metatarsal medial cuneiform joint area.  Pain with  dorsiflexion and plantarflexion of the digits is noted in the area of the tendon rubs at this midfoot point.  DERMATOLOGIC: skin is warm, supple, and dry.  Color, texture, and turgor of skin within normal limits.  No open wounds are noted.  No preulcerative lesions are seen.  Digital nails are asymptomatic.    X-ray: 3 views of the left foot are obtained.  A slight dorsal spur is present on the medial cuneiform.  No acute osseous abnormalities are seen.  No sign of fracture or dislocation is noted.    Assessment     ICD-10-CM   1. Left foot pain  M79.672 DG Foot Complete Left    2. Bone spur of left  foot  M77.52 triamcinolone acetonide (KENALOG) 10 MG/ML injection 10 mg    3. Capsulitis of left foot  M77.8         Plan  Discussed exam and x-ray findings with the patient.  Recommended trying some steroid in the region to try and alleviate her pain.  She agreed to prepped the skin with alcohol and infiltrated 5 mg of Kenalog with 0.5% Marcaine plain in the area of maximal tenderness dorsal aspect of the left foot.  Also discussed shoe gear changes and lacing patterns and keeping pressure off of this area of her foot with shoes.  She will call if the pain does not resolve otherwise she will be seen back as needed for follow-up.

## 2021-10-20 ENCOUNTER — Other Ambulatory Visit: Payer: Self-pay | Admitting: Podiatrist

## 2021-10-20 DIAGNOSIS — M779 Enthesopathy, unspecified: Secondary | ICD-10-CM

## 2021-10-20 DIAGNOSIS — M778 Other enthesopathies, not elsewhere classified: Secondary | ICD-10-CM

## 2021-11-27 DIAGNOSIS — L820 Inflamed seborrheic keratosis: Secondary | ICD-10-CM | POA: Diagnosis not present

## 2021-11-27 DIAGNOSIS — L578 Other skin changes due to chronic exposure to nonionizing radiation: Secondary | ICD-10-CM | POA: Diagnosis not present

## 2021-11-27 DIAGNOSIS — L821 Other seborrheic keratosis: Secondary | ICD-10-CM | POA: Diagnosis not present

## 2021-12-11 DIAGNOSIS — Z7189 Other specified counseling: Secondary | ICD-10-CM | POA: Diagnosis not present

## 2021-12-11 DIAGNOSIS — E785 Hyperlipidemia, unspecified: Secondary | ICD-10-CM | POA: Diagnosis not present

## 2021-12-11 DIAGNOSIS — E039 Hypothyroidism, unspecified: Secondary | ICD-10-CM | POA: Diagnosis not present

## 2021-12-11 DIAGNOSIS — Z6824 Body mass index (BMI) 24.0-24.9, adult: Secondary | ICD-10-CM | POA: Diagnosis not present

## 2021-12-11 DIAGNOSIS — Z1331 Encounter for screening for depression: Secondary | ICD-10-CM | POA: Diagnosis not present

## 2021-12-11 DIAGNOSIS — D692 Other nonthrombocytopenic purpura: Secondary | ICD-10-CM | POA: Diagnosis not present

## 2021-12-11 DIAGNOSIS — F419 Anxiety disorder, unspecified: Secondary | ICD-10-CM | POA: Diagnosis not present

## 2021-12-11 DIAGNOSIS — E1169 Type 2 diabetes mellitus with other specified complication: Secondary | ICD-10-CM | POA: Diagnosis not present

## 2021-12-11 DIAGNOSIS — Z0000 Encounter for general adult medical examination without abnormal findings: Secondary | ICD-10-CM | POA: Diagnosis not present

## 2021-12-11 DIAGNOSIS — I10 Essential (primary) hypertension: Secondary | ICD-10-CM | POA: Diagnosis not present

## 2021-12-11 DIAGNOSIS — Z1231 Encounter for screening mammogram for malignant neoplasm of breast: Secondary | ICD-10-CM | POA: Diagnosis not present

## 2022-01-16 DIAGNOSIS — Z1231 Encounter for screening mammogram for malignant neoplasm of breast: Secondary | ICD-10-CM | POA: Diagnosis not present

## 2022-01-26 DIAGNOSIS — Z961 Presence of intraocular lens: Secondary | ICD-10-CM | POA: Diagnosis not present

## 2022-01-26 DIAGNOSIS — H43393 Other vitreous opacities, bilateral: Secondary | ICD-10-CM | POA: Diagnosis not present

## 2022-01-26 DIAGNOSIS — B0233 Zoster keratitis: Secondary | ICD-10-CM | POA: Diagnosis not present

## 2022-01-26 DIAGNOSIS — H16143 Punctate keratitis, bilateral: Secondary | ICD-10-CM | POA: Diagnosis not present

## 2022-01-26 DIAGNOSIS — H18052 Posterior corneal pigmentations, left eye: Secondary | ICD-10-CM | POA: Diagnosis not present

## 2022-01-26 DIAGNOSIS — B0232 Zoster iridocyclitis: Secondary | ICD-10-CM | POA: Diagnosis not present

## 2022-02-19 DIAGNOSIS — B0233 Zoster keratitis: Secondary | ICD-10-CM | POA: Diagnosis not present

## 2022-02-19 DIAGNOSIS — H43393 Other vitreous opacities, bilateral: Secondary | ICD-10-CM | POA: Diagnosis not present

## 2022-02-19 DIAGNOSIS — Z961 Presence of intraocular lens: Secondary | ICD-10-CM | POA: Diagnosis not present

## 2022-02-19 DIAGNOSIS — H18052 Posterior corneal pigmentations, left eye: Secondary | ICD-10-CM | POA: Diagnosis not present

## 2022-02-19 DIAGNOSIS — H16143 Punctate keratitis, bilateral: Secondary | ICD-10-CM | POA: Diagnosis not present

## 2022-02-19 DIAGNOSIS — B0232 Zoster iridocyclitis: Secondary | ICD-10-CM | POA: Diagnosis not present

## 2022-03-16 DIAGNOSIS — M654 Radial styloid tenosynovitis [de Quervain]: Secondary | ICD-10-CM | POA: Diagnosis not present

## 2022-03-16 DIAGNOSIS — M79642 Pain in left hand: Secondary | ICD-10-CM | POA: Diagnosis not present

## 2022-03-24 DIAGNOSIS — Z79899 Other long term (current) drug therapy: Secondary | ICD-10-CM | POA: Diagnosis not present

## 2022-03-24 DIAGNOSIS — E1169 Type 2 diabetes mellitus with other specified complication: Secondary | ICD-10-CM | POA: Diagnosis not present

## 2022-03-24 DIAGNOSIS — F419 Anxiety disorder, unspecified: Secondary | ICD-10-CM | POA: Diagnosis not present

## 2022-03-24 DIAGNOSIS — M1812 Unilateral primary osteoarthritis of first carpometacarpal joint, left hand: Secondary | ICD-10-CM | POA: Diagnosis not present

## 2022-03-24 DIAGNOSIS — E039 Hypothyroidism, unspecified: Secondary | ICD-10-CM | POA: Diagnosis not present

## 2022-03-24 DIAGNOSIS — E785 Hyperlipidemia, unspecified: Secondary | ICD-10-CM | POA: Diagnosis not present

## 2022-03-24 DIAGNOSIS — Z6825 Body mass index (BMI) 25.0-25.9, adult: Secondary | ICD-10-CM | POA: Diagnosis not present

## 2022-03-24 DIAGNOSIS — I10 Essential (primary) hypertension: Secondary | ICD-10-CM | POA: Diagnosis not present

## 2022-04-13 DIAGNOSIS — M1812 Unilateral primary osteoarthritis of first carpometacarpal joint, left hand: Secondary | ICD-10-CM | POA: Diagnosis not present

## 2022-04-13 DIAGNOSIS — M654 Radial styloid tenosynovitis [de Quervain]: Secondary | ICD-10-CM | POA: Diagnosis not present

## 2022-04-23 DIAGNOSIS — H9202 Otalgia, left ear: Secondary | ICD-10-CM | POA: Diagnosis not present

## 2022-04-23 DIAGNOSIS — Z23 Encounter for immunization: Secondary | ICD-10-CM | POA: Diagnosis not present

## 2022-09-22 DIAGNOSIS — E039 Hypothyroidism, unspecified: Secondary | ICD-10-CM | POA: Diagnosis not present

## 2022-09-22 DIAGNOSIS — Z6825 Body mass index (BMI) 25.0-25.9, adult: Secondary | ICD-10-CM | POA: Diagnosis not present

## 2022-09-22 DIAGNOSIS — E785 Hyperlipidemia, unspecified: Secondary | ICD-10-CM | POA: Diagnosis not present

## 2022-09-22 DIAGNOSIS — I10 Essential (primary) hypertension: Secondary | ICD-10-CM | POA: Diagnosis not present

## 2022-09-22 DIAGNOSIS — Z79899 Other long term (current) drug therapy: Secondary | ICD-10-CM | POA: Diagnosis not present

## 2022-09-22 DIAGNOSIS — F419 Anxiety disorder, unspecified: Secondary | ICD-10-CM | POA: Diagnosis not present

## 2022-09-22 DIAGNOSIS — H109 Unspecified conjunctivitis: Secondary | ICD-10-CM | POA: Diagnosis not present

## 2022-09-22 DIAGNOSIS — E1169 Type 2 diabetes mellitus with other specified complication: Secondary | ICD-10-CM | POA: Diagnosis not present

## 2022-12-10 DIAGNOSIS — L821 Other seborrheic keratosis: Secondary | ICD-10-CM | POA: Diagnosis not present

## 2022-12-10 DIAGNOSIS — D225 Melanocytic nevi of trunk: Secondary | ICD-10-CM | POA: Diagnosis not present

## 2022-12-10 DIAGNOSIS — L570 Actinic keratosis: Secondary | ICD-10-CM | POA: Diagnosis not present

## 2022-12-10 DIAGNOSIS — L578 Other skin changes due to chronic exposure to nonionizing radiation: Secondary | ICD-10-CM | POA: Diagnosis not present

## 2022-12-10 DIAGNOSIS — L820 Inflamed seborrheic keratosis: Secondary | ICD-10-CM | POA: Diagnosis not present

## 2023-01-15 DIAGNOSIS — Z6826 Body mass index (BMI) 26.0-26.9, adult: Secondary | ICD-10-CM | POA: Diagnosis not present

## 2023-01-15 DIAGNOSIS — Z1231 Encounter for screening mammogram for malignant neoplasm of breast: Secondary | ICD-10-CM | POA: Diagnosis not present

## 2023-01-15 DIAGNOSIS — Z7189 Other specified counseling: Secondary | ICD-10-CM | POA: Diagnosis not present

## 2023-01-15 DIAGNOSIS — Z79899 Other long term (current) drug therapy: Secondary | ICD-10-CM | POA: Diagnosis not present

## 2023-01-15 DIAGNOSIS — E1169 Type 2 diabetes mellitus with other specified complication: Secondary | ICD-10-CM | POA: Diagnosis not present

## 2023-01-15 DIAGNOSIS — I10 Essential (primary) hypertension: Secondary | ICD-10-CM | POA: Diagnosis not present

## 2023-01-15 DIAGNOSIS — Z1331 Encounter for screening for depression: Secondary | ICD-10-CM | POA: Diagnosis not present

## 2023-01-15 DIAGNOSIS — E785 Hyperlipidemia, unspecified: Secondary | ICD-10-CM | POA: Diagnosis not present

## 2023-01-15 DIAGNOSIS — Z0000 Encounter for general adult medical examination without abnormal findings: Secondary | ICD-10-CM | POA: Diagnosis not present

## 2023-01-15 DIAGNOSIS — E039 Hypothyroidism, unspecified: Secondary | ICD-10-CM | POA: Diagnosis not present

## 2023-01-19 DIAGNOSIS — Z1231 Encounter for screening mammogram for malignant neoplasm of breast: Secondary | ICD-10-CM | POA: Diagnosis not present

## 2023-01-26 DIAGNOSIS — Z23 Encounter for immunization: Secondary | ICD-10-CM | POA: Diagnosis not present

## 2023-05-20 DIAGNOSIS — E1169 Type 2 diabetes mellitus with other specified complication: Secondary | ICD-10-CM | POA: Diagnosis not present

## 2023-05-20 DIAGNOSIS — F419 Anxiety disorder, unspecified: Secondary | ICD-10-CM | POA: Diagnosis not present

## 2023-05-20 DIAGNOSIS — M1812 Unilateral primary osteoarthritis of first carpometacarpal joint, left hand: Secondary | ICD-10-CM | POA: Diagnosis not present

## 2023-05-20 DIAGNOSIS — E039 Hypothyroidism, unspecified: Secondary | ICD-10-CM | POA: Diagnosis not present

## 2023-05-20 DIAGNOSIS — Z79899 Other long term (current) drug therapy: Secondary | ICD-10-CM | POA: Diagnosis not present

## 2023-05-20 DIAGNOSIS — E785 Hyperlipidemia, unspecified: Secondary | ICD-10-CM | POA: Diagnosis not present

## 2023-05-20 DIAGNOSIS — Z6826 Body mass index (BMI) 26.0-26.9, adult: Secondary | ICD-10-CM | POA: Diagnosis not present

## 2023-05-20 DIAGNOSIS — I10 Essential (primary) hypertension: Secondary | ICD-10-CM | POA: Diagnosis not present

## 2023-07-06 ENCOUNTER — Encounter: Payer: Self-pay | Admitting: Gastroenterology

## 2023-07-21 DIAGNOSIS — E039 Hypothyroidism, unspecified: Secondary | ICD-10-CM | POA: Diagnosis not present

## 2023-09-22 DIAGNOSIS — E039 Hypothyroidism, unspecified: Secondary | ICD-10-CM | POA: Diagnosis not present

## 2023-09-22 DIAGNOSIS — Z6826 Body mass index (BMI) 26.0-26.9, adult: Secondary | ICD-10-CM | POA: Diagnosis not present

## 2023-09-22 DIAGNOSIS — E1169 Type 2 diabetes mellitus with other specified complication: Secondary | ICD-10-CM | POA: Diagnosis not present

## 2023-09-22 DIAGNOSIS — Z79899 Other long term (current) drug therapy: Secondary | ICD-10-CM | POA: Diagnosis not present

## 2023-09-22 DIAGNOSIS — I10 Essential (primary) hypertension: Secondary | ICD-10-CM | POA: Diagnosis not present

## 2023-09-22 DIAGNOSIS — E785 Hyperlipidemia, unspecified: Secondary | ICD-10-CM | POA: Diagnosis not present

## 2023-10-28 DIAGNOSIS — M1812 Unilateral primary osteoarthritis of first carpometacarpal joint, left hand: Secondary | ICD-10-CM | POA: Diagnosis not present

## 2023-10-28 DIAGNOSIS — M654 Radial styloid tenosynovitis [de Quervain]: Secondary | ICD-10-CM | POA: Diagnosis not present

## 2023-12-13 DIAGNOSIS — L578 Other skin changes due to chronic exposure to nonionizing radiation: Secondary | ICD-10-CM | POA: Diagnosis not present

## 2023-12-13 DIAGNOSIS — C44311 Basal cell carcinoma of skin of nose: Secondary | ICD-10-CM | POA: Diagnosis not present

## 2023-12-13 DIAGNOSIS — L821 Other seborrheic keratosis: Secondary | ICD-10-CM | POA: Diagnosis not present

## 2024-01-25 DIAGNOSIS — E1169 Type 2 diabetes mellitus with other specified complication: Secondary | ICD-10-CM | POA: Diagnosis not present

## 2024-01-25 DIAGNOSIS — E039 Hypothyroidism, unspecified: Secondary | ICD-10-CM | POA: Diagnosis not present

## 2024-01-25 DIAGNOSIS — E785 Hyperlipidemia, unspecified: Secondary | ICD-10-CM | POA: Diagnosis not present

## 2024-01-25 DIAGNOSIS — Z1331 Encounter for screening for depression: Secondary | ICD-10-CM | POA: Diagnosis not present

## 2024-01-25 DIAGNOSIS — M25562 Pain in left knee: Secondary | ICD-10-CM | POA: Diagnosis not present

## 2024-01-25 DIAGNOSIS — Z79899 Other long term (current) drug therapy: Secondary | ICD-10-CM | POA: Diagnosis not present

## 2024-01-25 DIAGNOSIS — Z6826 Body mass index (BMI) 26.0-26.9, adult: Secondary | ICD-10-CM | POA: Diagnosis not present

## 2024-01-25 DIAGNOSIS — M26622 Arthralgia of left temporomandibular joint: Secondary | ICD-10-CM | POA: Diagnosis not present

## 2024-01-25 DIAGNOSIS — Z0000 Encounter for general adult medical examination without abnormal findings: Secondary | ICD-10-CM | POA: Diagnosis not present

## 2024-01-25 DIAGNOSIS — I10 Essential (primary) hypertension: Secondary | ICD-10-CM | POA: Diagnosis not present

## 2024-03-04 DIAGNOSIS — Z23 Encounter for immunization: Secondary | ICD-10-CM | POA: Diagnosis not present

## 2024-03-14 DIAGNOSIS — Z1231 Encounter for screening mammogram for malignant neoplasm of breast: Secondary | ICD-10-CM | POA: Diagnosis not present

## 2024-03-20 ENCOUNTER — Telehealth: Payer: Self-pay

## 2024-03-20 NOTE — Patient Instructions (Signed)
 Visit Information  Thank you for taking time to visit with me today.   Contact a healthcare provider or urgent care if you experience: Severe abdominal pain that doesn't go away Persistent diarrhea or vomiting High fever or blood/mucus in stool   UTI Drink plenty of water or other fluids. Take antibiotics exactly as your healthcare provider tells you. Monitor for fever, chills, increased urination, burning on urination, blood in urine, nausea, vomiting, or pain in abdomen    The patient verbalized understanding of instructions, educational materials, and care plan provided today and DECLINED offer to receive copy of patient instructions, educational materials, and care plan.   The patient has been provided with contact information for the care management team and has been advised to call with any health related questions or concerns.   Please call the care guide team at [phone] if you need to cancel or reschedule your appointment.   Please call the Suicide and Crisis Lifeline: 988 if you are experiencing a Mental Health or Behavioral Health Crisis or need someone to talk to.  Slayton Lubitz J. Shakiya Mcneary RN, MSN [facility], [facility] RN Care Manager Direct Dial: [phone]  Fax: [phone] Website: [email or web address]

## 2024-03-20 NOTE — Transitions of Care (Post Inpatient/ED Visit) (Signed)
   03/20/2024  Name: Laurie Parker MRN: [id number] DOB: [birth year]  Today's TOC FU Call Status: Today's TOC FU Call Status:: Successful TOC FU Call Completed TOC FU Call Complete Date: 03/20/24 Patient's Name and Date of Birth confirmed.  Transition Care Management Follow-up Telephone Call Date of Discharge: 03/19/24 Discharge Facility: Other [facility]) Name of Other (Non[facility]) Discharge Facility: [facility] Type of Discharge: Inpatient Admission Primary Inpatient Discharge Diagnosis:: Diverticulitis and UTI per patient How have you been since you were released from the hospital?: Better Any questions or concerns?: No  Items Reviewed: Did you receive and understand the discharge instructions provided?: Yes Medications obtained,verified, and reconciled?: No Medications Not Reviewed Reasons:: Other: (Patient declines medication review. [place] [employer] I can't get into that right now.) Any new allergies since your discharge?: No Dietary orders reviewed?: Yes Type of Diet Ordered:: Low salt Do you have support at home?: Yes People in Home [RPT]: spouse Name of Support/Comfort Primary Source: Laurie Parker  Medications Reviewed Today: Medications Reviewed Today   Medications were not reviewed in this encounter     Home Care and Equipment/Supplies: Were Home Health Services Ordered?: NA Any new equipment or medical supplies ordered?: NA  Functional Questionnaire: Do you need assistance with bathing/showering or dressing?: No Do you need assistance with meal preparation?: No Do you need assistance with eating?: No Do you have difficulty maintaining continence: No Do you need assistance with getting out of bed/getting out of a chair/moving?: No Do you have difficulty managing or taking your medications?: No  Follow up appointments reviewed: PCP Follow-up appointment confirmed?: No (patient to call this morning) MD Provider Line [phone] Given: No Specialist  Hospital Follow-up appointment confirmed?: NA Do you need transportation to your follow-up appointment?: No Do you understand care options if your condition(s) worsen?: Yes-patient verbalized understanding  SDOH Interventions Today    Flowsheet Row Most Recent Value  SDOH Interventions   Food Insecurity Interventions Intervention Not Indicated  Housing Interventions Intervention Not Indicated  Transportation Interventions Intervention Not Indicated  Utilities Interventions Intervention Not Indicated    [name] J. Fara Worthy RN, MSN [facility]  [facility], [facility] RN Care Manager Direct Dial: [phone]  Fax: [phone] Website: [email or web address]

## 2024-03-22 DIAGNOSIS — Z6826 Body mass index (BMI) 26.0-26.9, adult: Secondary | ICD-10-CM | POA: Diagnosis not present

## 2024-03-22 DIAGNOSIS — N390 Urinary tract infection, site not specified: Secondary | ICD-10-CM | POA: Diagnosis not present

## 2024-03-22 DIAGNOSIS — K5732 Diverticulitis of large intestine without perforation or abscess without bleeding: Secondary | ICD-10-CM | POA: Diagnosis not present

## 2024-03-23 DIAGNOSIS — R109 Unspecified abdominal pain: Secondary | ICD-10-CM | POA: Diagnosis not present

## 2024-03-23 DIAGNOSIS — K5792 Diverticulitis of intestine, part unspecified, without perforation or abscess without bleeding: Secondary | ICD-10-CM | POA: Diagnosis not present

## 2024-04-08 ENCOUNTER — Ambulatory Visit (HOSPITAL_BASED_OUTPATIENT_CLINIC_OR_DEPARTMENT_OTHER): Payer: Self-pay | Admitting: Family Medicine

## 2024-04-08 ENCOUNTER — Encounter (HOSPITAL_BASED_OUTPATIENT_CLINIC_OR_DEPARTMENT_OTHER): Payer: Self-pay | Admitting: Emergency Medicine

## 2024-04-08 ENCOUNTER — Ambulatory Visit (INDEPENDENT_AMBULATORY_CARE_PROVIDER_SITE_OTHER): Admit: 2024-04-08 | Discharge: 2024-04-08 | Disposition: A | Admitting: Radiology

## 2024-04-08 ENCOUNTER — Ambulatory Visit (HOSPITAL_BASED_OUTPATIENT_CLINIC_OR_DEPARTMENT_OTHER)
Admission: EM | Admit: 2024-04-08 | Discharge: 2024-04-08 | Disposition: A | Discharge status: Home / Self Care | Attending: Family Medicine | Admitting: Family Medicine

## 2024-04-08 DIAGNOSIS — M47812 Spondylosis without myelopathy or radiculopathy, cervical region: Secondary | ICD-10-CM | POA: Diagnosis not present

## 2024-04-08 DIAGNOSIS — M5030 Other cervical disc degeneration, unspecified cervical region: Secondary | ICD-10-CM | POA: Diagnosis not present

## 2024-04-08 DIAGNOSIS — M25512 Pain in left shoulder: Secondary | ICD-10-CM

## 2024-04-08 DIAGNOSIS — M8580 Other specified disorders of bone density and structure, unspecified site: Secondary | ICD-10-CM | POA: Diagnosis not present

## 2024-04-08 DIAGNOSIS — M25511 Pain in right shoulder: Secondary | ICD-10-CM

## 2024-04-08 DIAGNOSIS — M542 Cervicalgia: Secondary | ICD-10-CM

## 2024-04-08 HISTORY — DX: Diverticulitis of intestine, part unspecified, without perforation or abscess without bleeding: K57.92

## 2024-04-08 MED ORDER — CYCLOBENZAPRINE HCL 5 MG PO TABS: 5.0000 mg | ORAL_TABLET | Freq: Every evening | ORAL | 0 refills | Status: AC | PRN

## 2024-04-08 MED ORDER — TRAMADOL HCL 50 MG PO TABS: 50.0000 mg | ORAL_TABLET | Freq: Four times a day (QID) | ORAL | 0 refills | Status: AC | PRN

## 2024-04-08 NOTE — Discharge Instructions (Addendum)
 Neck pain and pain of shoulders: Cervical x-ray appears negative but does show some arthritic changes.  The x-ray has not been read by the radiologist.  Will update the patient once radiology reviews the x-ray.  Cyclobenzaprine, 5 mg, 1 pill nightly for neck spasms.  Do not use cyclobenzaprine and drive.  May use acetaminophen, 500 to 650 mg every 8 hours if needed for pain.  Tramadol, 50 mg, every 6 hours if needed for pain.  Do not use tramadol and drive.  Could use tramadol and cyclobenzaprine together.  If you have used both of those medicines and still have pain wait 2 hours and try acetaminophen 500 mg.  If neck pain persist needs to see primary care.  May need to see an orthopedist or have further workup.  See below for signs and symptoms of worsening condition and reasons to go to an emergency room.  Contact your primary care provider, if: You have symptoms that get worse or do not get better after 2 weeks of treatment. You have new symptoms. Your pain gets worse or does not get better with medicine. You have sores or irritated skin on your neck from wearing your cervical collar. Get help right away if: You have severe pain. You develop numbness, tingling, or weakness in any part of your body. You cannot move a part of your body (you have paralysis). You have neck pain along with severe dizziness or headache.

## 2024-04-08 NOTE — ED Provider Notes (Signed)
 [facility]    CSN: [id number] Arrival date & time: 04/08/24  0857      History   Chief Complaint Chief Complaint  Patient presents with   Neck Pain    HPI Laurie Parker is a 83 y.o. female.   83 year old female who reports that on Tuesday, 04/04/2024, she was drying her hair and she moved her head and had severe neck pain with a popping sound.  Since that time she has had neck pain and pain with movement of her head.  She denies headaches.  She has sometimes had shoulder pain and even some upper chest pain around her neck and shoulders.  She has not had palpitations, central chest pain, left arm pain, diaphoresis or anything else that she thinks of his cardiac pain.  She is having trouble sleeping due to the neck pain.  She has been trying to sleep in a chair or recliner.  OTC medication has not helped her neck pain.  Only blood work available in the chart is from 2016.  In speaking to the patient she reports that something was abnormal on blood work and she is actually going back to her doctor soon to discuss her kidney function.  She does not know if she has kidney problems or not but that is what she was told after her blood work.   Neck Pain Associated symptoms: no chest pain and no fever     Past Medical History:  Diagnosis Date   Allergy    Bronchitis    Diverticulitis    History of pulmonary embolus (PE)    Hyperlipidemia    Thyroid  disease     Patient Active Problem List   Diagnosis Date Noted   Postoperative examination 10/16/2015   Right upper quadrant abdominal pain 08/21/2015   Abnormal electrocardiogram (ECG) (EKG) 10/23/2014   Anticoagulated 10/19/2014   Contusion of abdominal wall 10/19/2014   Encounter for preprocedural cardiovascular examination 10/19/2014   Incisional hernia without obstruction or gangrene 10/19/2014    Past Surgical History:  Procedure Laterality Date   ABDOMINAL HYSTERECTOMY     BACK SURGERY     x2   COLONOSCOPY      COLOSTOMY  2013   HERNIA REPAIR     POLYPECTOMY     TONSILLECTOMY      OB History   No obstetric history on file.      Home Medications    Prior to Admission medications   Medication Sig Start Date End Date Taking? Authorizing Provider  amLODipine (NORVASC) 5 MG tablet amlodipine 5 mg tablet  TAKE 1 TABLET BY MOUTH ONCE DAILY FOR HIGH BLOOD PRESSURE   Yes [provider]  atorvastatin (LIPITOR) 10 MG tablet Take 10 mg by mouth daily.   Yes [provider]  cyclobenzaprine  (FLEXERIL ) 5 MG tablet Take 1 tablet (5 mg total) by mouth at bedtime as needed for up to 15 days for muscle spasms. 04/08/24 04/23/24 Yes Ival Domino, FNP  levothyroxine (SYNTHROID, LEVOTHROID) 50 MCG tablet Take 1 tablet by mouth daily.   Yes [provider]  montelukast (SINGULAIR) 10 MG tablet Take 1 tablet by mouth daily. 06/05/15  Yes [provider]  pioglitazone (ACTOS) 15 MG tablet pioglitazone 15 mg tablet  TAKE 1 TABLET BY MOUTH ONCE DAILY FOR BLOOD SUGAR   Yes [provider]  traMADol  (ULTRAM ) 50 MG tablet Take 1 tablet (50 mg total) by mouth every 6 (six) hours as needed. 04/08/24  Yes Ival Domino,  FNP  ACCU-CHEK GUIDE test strip USE 1 STRIP ONCE DAILY 05/21/21   [provider]  Calcium Carb-Cholecalciferol (CALCIUM 600 + D PO) Take by mouth.    [provider]  cholecalciferol (VITAMIN D) 25 MCG (1000 UNIT) tablet  06/12/21   [provider]  Flax OIL Take by mouth.    [provider]  Magnesium Hydroxide (DULCOLAX PO) Take by mouth.    [provider]    Family History Family History  Problem Relation Age of Onset   Colon cancer Neg Hx    Colon polyps Neg Hx    Esophageal cancer Neg Hx    Prostate cancer Neg Hx    Rectal cancer Neg Hx     Social History Social History   Tobacco Use   Smoking status: Never   Smokeless tobacco: Never  Vaping Use   Vaping status: Never Used  Substance Use  Topics   Alcohol  use: Not Currently   Drug use: Not Currently     Allergies   Codeine and Penicillins   Review of Systems Review of Systems  Constitutional:  Negative for chills and fever.  HENT:  Negative for ear pain and sore throat.   Eyes:  Negative for pain and visual disturbance.  Respiratory:  Negative for cough and shortness of breath.   Cardiovascular:  Negative for chest pain and palpitations.  Gastrointestinal:  Negative for abdominal pain, constipation, diarrhea, nausea and vomiting.  Genitourinary:  Negative for dysuria and hematuria.  Musculoskeletal:  Positive for neck pain. Negative for arthralgias and back pain.  Skin:  Negative for color change and rash.  Neurological:  Negative for seizures and syncope.  All other systems reviewed and are negative.    Physical Exam Triage Vital Signs ED Triage Vitals  Encounter Vitals Group     BP 04/08/24 0955 132/78     Girls Systolic BP Percentile --      Girls Diastolic BP Percentile --      Boys Systolic BP Percentile --      Boys Diastolic BP Percentile --      Pulse Rate 04/08/24 0955 76     Resp 04/08/24 0955 18     Temp 04/08/24 0955 97.8 F (36.6 C)     Temp Source 04/08/24 0955 Oral     SpO2 04/08/24 0955 97 %     Weight --      Height --      Head Circumference --      Peak Flow --      Pain Score 04/08/24 0953 6     Pain Loc --      Pain Education --      Exclude from Growth Chart --    No data found.  Updated Vital Signs BP 132/78 (BP Location: Left Arm)   Pulse 76   Temp 97.8 F (36.6 C) (Oral)   Resp 18   SpO2 97%   Visual Acuity Right Eye Distance:   Left Eye Distance:   Bilateral Distance:    Right Eye Near:   Left Eye Near:    Bilateral Near:     Physical Exam Vitals and nursing note reviewed.  Constitutional:      General: She is not in acute distress.    Appearance: She is well-developed. She is not ill-appearing or toxic-appearing.  HENT:     Head: Normocephalic and  atraumatic.     Right Ear: Hearing, tympanic membrane, ear canal and external ear normal.  Left Ear: Hearing, tympanic membrane, ear canal and external ear normal.     Nose: No congestion or rhinorrhea.     Right Sinus: No maxillary sinus tenderness or frontal sinus tenderness.     Left Sinus: No maxillary sinus tenderness or frontal sinus tenderness.     Mouth/Throat:     Lips: Pink.     Mouth: Mucous membranes are moist.     Pharynx: Uvula midline. No oropharyngeal exudate or posterior oropharyngeal erythema.     Tonsils: No tonsillar exudate.  Eyes:     Conjunctiva/sclera: Conjunctivae normal.     Pupils: Pupils are equal, round, and reactive to light.  Cardiovascular:     Rate and Rhythm: Normal rate and regular rhythm.     Heart sounds: S1 normal and S2 normal. No murmur heard. Pulmonary:     Effort: Pulmonary effort is normal. No respiratory distress.     Breath sounds: Normal breath sounds. No decreased breath sounds, wheezing, rhonchi or rales.  Abdominal:     General: Bowel sounds are normal.     Palpations: Abdomen is soft.     Tenderness: There is no abdominal tenderness.  Musculoskeletal:        General: No swelling.     Right shoulder: Tenderness (Along the trapezius muscle) present. No swelling, deformity, effusion, laceration, bony tenderness or crepitus. Normal range of motion. Normal strength. Normal pulse.     Left shoulder: Tenderness (Along the trapezius muscle) present. No swelling, deformity, effusion, laceration, bony tenderness or crepitus. Normal range of motion. Normal strength. Normal pulse.     Right upper arm: Normal.     Left upper arm: Normal.     Right elbow: Normal.     Left elbow: Normal.     Right forearm: Normal.     Left forearm: Normal.     Right wrist: Normal.     Right hand: Normal.     Left hand: Normal.     Cervical back: Neck supple. Spasms, torticollis (Mild to the right) and tenderness present. No swelling, edema, deformity,  erythema, signs of trauma, lacerations, rigidity, bony tenderness or crepitus. Pain with movement and muscular tenderness present. No spinous process tenderness. Decreased range of motion (Due to pain).  Lymphadenopathy:     Head:     Right side of head: No submental, submandibular, tonsillar, preauricular or posterior auricular adenopathy.     Left side of head: No submental, submandibular, tonsillar, preauricular or posterior auricular adenopathy.     Cervical: No cervical adenopathy.     Right cervical: No superficial cervical adenopathy.    Left cervical: No superficial cervical adenopathy.  Skin:    General: Skin is warm and dry.     Capillary Refill: Capillary refill takes less than 2 seconds.     Findings: No rash.  Neurological:     Mental Status: She is alert and oriented to person, place, and time.  Psychiatric:        Mood and Affect: Mood normal.      UC Treatments / Results  Labs (all labs ordered are listed, but only abnormal results are displayed) Labs Reviewed - No data to display  EKG   Radiology No results found.  Procedures Procedures (including critical care time)  Medications Ordered in UC Medications - No data to display  Initial Impression / Assessment and Plan / UC Course  I have reviewed the triage vital signs and the nursing notes.  Pertinent labs & imaging results that were  available during my care of the patient were reviewed by me and considered in my medical decision making (see chart for details).  Plan of Care: Neck pain and pain of shoulders: Cervical x-ray appears negative but does show some arthritic changes.  The x-ray has not been read by the radiologist.  Will update the patient once radiology reviews the x-ray.  Cyclobenzaprine, 5 mg, 1 pill nightly for neck spasms.  Do not use cyclobenzaprine and drive.  May use acetaminophen, 500 to 650 mg every 8 hours if needed for pain.  Tramadol, 50 mg, every 6 hours if needed for pain.  Do not  use tramadol and drive.  Could use tramadol and cyclobenzaprine together.  If you have used both of those medicines and still have pain wait 2 hours and try acetaminophen 500 mg.  If neck pain persist needs to see primary care.  May need to see an orthopedist or have further workup.  See below for signs and symptoms of worsening condition and reasons to go to an emergency room.  I reviewed the plan of care with the patient and/or the patient's guardian.  The patient and/or guardian had time to ask questions and acknowledged that the questions were answered.  I provided instruction on symptoms or reasons to return here or to go to an ER, if symptoms/condition did not improve, worsened or if new symptoms occurred.  Final Clinical Impressions(s) / UC Diagnoses   Final diagnoses:  Neck pain  Acute pain of both shoulders     Discharge Instructions      Neck pain and pain of shoulders: Cervical x-ray appears negative but does show some arthritic changes.  The x-ray has not been read by the radiologist.  Will update the patient once radiology reviews the x-ray.  Cyclobenzaprine, 5 mg, 1 pill nightly for neck spasms.  Do not use cyclobenzaprine and drive.  May use acetaminophen, 500 to 650 mg every 8 hours if needed for pain.  Tramadol, 50 mg, every 6 hours if needed for pain.  Do not use tramadol and drive.  Could use tramadol and cyclobenzaprine together.  If you have used both of those medicines and still have pain wait 2 hours and try acetaminophen 500 mg.  If neck pain persist needs to see primary care.  May need to see an orthopedist or have further workup.  See below for signs and symptoms of worsening condition and reasons to go to an emergency room.  Contact your primary care provider, if: You have symptoms that get worse or do not get better after 2 weeks of treatment. You have new symptoms. Your pain gets worse or does not get better with medicine. You have sores or irritated skin on  your neck from wearing your cervical collar. Get help right away if: You have severe pain. You develop numbness, tingling, or weakness in any part of your body. You cannot move a part of your body (you have paralysis). You have neck pain along with severe dizziness or headache.     ED Prescriptions     Medication Sig Dispense Auth. Provider   cyclobenzaprine (FLEXERIL) 5 MG tablet Take 1 tablet (5 mg total) by mouth at bedtime as needed for up to 15 days for muscle spasms. 15 tablet Elanda Garmany, FNP   traMADol (ULTRAM) 50 MG tablet Take 1 tablet (50 mg total) by mouth every 6 (six) hours as needed. 15 tablet Jamaree Hosier, FNP      I have reviewed the  PDMP during this encounter.   Ival Domino, FNP 04/08/24 1108

## 2024-04-08 NOTE — Progress Notes (Signed)
 Cervical spine x-ray of the neck shows some osteopenia or early bone thinning, degenerative disc disease and arthritis but was otherwise negative.  Patient was given a similar report during her visit, but I also told her I would call her once radiology reviewed her films.  I have tried to reach the patient on her listed telephone.  The phone rings but no one answers and there is no voicemail.  The patient does not yet have the portal.  These results are available if she calls then but she was given this report during the visit.

## 2024-04-08 NOTE — ED Triage Notes (Signed)
 Pt c/o neck pain after she washed her hair on Tuesday she had her neck bent to brush her hair and when she flipped her hair back she felt a pop in the back of her neck. Pt reports the pain is now in her shoulder are and in the front of her neck also.
